# Patient Record
Sex: Female | Born: 1995 | Race: Black or African American | Hispanic: No | Marital: Single | Smoking: Never smoker
Health system: Southern US, Community
[De-identification: ages and names within clinical notes are randomized; demographics above are authoritative.]

## PROBLEM LIST (undated history)

## (undated) DIAGNOSIS — Z789 Other specified health status: Secondary | ICD-10-CM

## (undated) HISTORY — PX: RHINOPLASTY: SUR1284

---

## 2019-02-03 DIAGNOSIS — Z3400 Encounter for supervision of normal first pregnancy, unspecified trimester: Secondary | ICD-10-CM | POA: Insufficient documentation

## 2019-05-06 ENCOUNTER — Other Ambulatory Visit (HOSPITAL_COMMUNITY)
Admission: RE | Admit: 2019-05-06 | Discharge: 2019-05-06 | Disposition: A | Discharge status: Home / Self Care | Payer: Medicaid Other | Source: Ambulatory Visit | Attending: Advanced Practice Midwife | Admitting: Advanced Practice Midwife

## 2019-05-06 ENCOUNTER — Ambulatory Visit (INDEPENDENT_AMBULATORY_CARE_PROVIDER_SITE_OTHER): Payer: BC Managed Care – PPO | Admitting: Advanced Practice Midwife

## 2019-05-06 ENCOUNTER — Encounter: Payer: Self-pay | Admitting: Advanced Practice Midwife

## 2019-05-06 ENCOUNTER — Other Ambulatory Visit: Payer: Self-pay

## 2019-05-06 DIAGNOSIS — Z3A22 22 weeks gestation of pregnancy: Secondary | ICD-10-CM | POA: Diagnosis not present

## 2019-05-06 DIAGNOSIS — Z3402 Encounter for supervision of normal first pregnancy, second trimester: Secondary | ICD-10-CM | POA: Diagnosis not present

## 2019-05-06 DIAGNOSIS — Z3400 Encounter for supervision of normal first pregnancy, unspecified trimester: Secondary | ICD-10-CM | POA: Insufficient documentation

## 2019-05-06 MED ORDER — VITAFOL GUMMIES 3.33-0.333-34.8 MG PO CHEW: 34.8000 mg | CHEWABLE_TABLET | Freq: Every day | ORAL | 10 refills | Status: DC

## 2019-05-06 MED FILL — VITAFOL GUMMIES: 3.33-0.333- | 30 days supply | Qty: 90 | Fill #0

## 2019-05-06 NOTE — Patient Instructions (Signed)

## 2019-05-06 NOTE — Progress Notes (Deleted)
  Subjective:    Kristin Zimmerman is being seen today for her first obstetrical visit.  This {is/is not:9024} a planned pregnancy. She is at [redacted]w[redacted]d gestation. Her obstetrical history is significant for {ob risk factors:10154}. Relationship with FOB: {fob:16621}. Patient {does/does not:19097} intend to breast feed. Pregnancy history fully reviewed.  Patient reports {sx:14538}.  Review of Systems:   Review of Systems  Objective:     BP 112/68   Pulse 96   Ht 5\' 7"  (1.702 m)   Wt 102.1 kg   LMP 11/29/2018 (Exact Date)   BMI 35.26 kg/m  Physical Exam  Exam    Assessment:    Pregnancy: G1P0 Patient Active Problem List   Diagnosis Date Noted  . Supervision of normal first pregnancy, antepartum 02/03/2019       Plan:     Initial labs drawn. Prenatal vitamins. Problem list reviewed and updated. AFP3 discussed: {requests/ordered/declines:14581}. Role of ultrasound in pregnancy discussed; fetal survey: {requests/ordered/declines:14581}. Amniocentesis discussed: {amniocentesis:14582}. Follow up in {numbers 0-4:31231} weeks. ***% of *** min visit spent on counseling and coordination of care.  ***   Hansel Feinstein 05/06/2019

## 2019-05-06 NOTE — Progress Notes (Signed)
  Subjective:    Kristin Zimmerman is being seen today for her first obstetrical visit.  This is a planned pregnancy. She is at 31w4dgestation. Her obstetrical history is significant for Primigravida. Relationship with FOB: significant other, living together. Patient does intend to breast feed. Pregnancy history fully reviewed.  Patient reports no complaints.  [place] [facility] [place] [place]  [place] boyfriend is from here, met [place].  [place].    Review of Systems:   Review of Systems  Constitutional: Negative for chills and fever.  Respiratory: Negative for shortness of breath.   Gastrointestinal: Negative for abdominal pain, constipation, diarrhea, nausea and vomiting.  Genitourinary: Negative for pelvic pain and vaginal bleeding.  Musculoskeletal: Negative for back pain.    Objective:     BP 112/68   Pulse 96   Ht _0  (1.702 m)   Wt 102.1 kg   LMP 11/29/2018 (Exact Date)   BMI 35.26 kg/m  Physical Exam  Constitutional: She is oriented to person, place, and time. She appears well-developed and well-nourished. No distress.  HENT:  Head: Normocephalic.  Cardiovascular: Normal rate and regular rhythm.  Respiratory: Effort normal. No respiratory distress.  GI: Soft. She exhibits no distension. There is no abdominal tenderness. There is no rebound and no guarding.  Genitourinary:    Vulva normal.     Genitourinary Comments: Cervix long and closed Pap done at other office Records pending   Musculoskeletal: Normal range of motion.  Neurological: She is alert and oriented to person, place, and time.  Skin: Skin is warm and dry.  Psychiatric: She has a normal mood and affect.    Maternal Exam:  Abdomen: Patient reports no abdominal tenderness. Fetal presentation: no presenting part  Introitus: Normal vulva. Normal vagina.  Pelvis: adequate for delivery.   Cervix: Cervix evaluated by digital exam.     Fetal Exam Fetal  Monitor Review: Mode: hand-held doppler probe.   Doppler done by RN Pelvic exam done to check for early dilation, closed/long         Assessment:    Pregnancy: G1P0 Patient Active Problem List   Diagnosis Date Noted  . Supervision of normal first pregnancy, antepartum 05/06/2019       Plan:  Welcomed to practice Reviewed structure of practice including learners [facility]   Initial labs drawn. Prenatal vitamins. Problem list reviewed and updated. Panorama discussed: requested. Role of ultrasound in pregnancy discussed; fetal survey: ordered. Amniocentesis discussed: not indicated. Follow up in 4 weeks. 50% of 30 min visit spent on counseling and coordination of care.     MHansel Feinstein9/15/2020

## 2019-05-07 LAB — URINE CYTOLOGY ANCILLARY ONLY
Chlamydia: NEGATIVE
Neisseria Gonorrhea: NEGATIVE

## 2019-05-08 LAB — AFP, SERUM, OPEN SPINA BIFIDA
AFP MoM: 1.37
AFP Value: 90.7 ng/mL
Gest. Age on Collection Date: 22.4 weeks
Maternal Age At EDD: 23.1 yr
OSBR Risk 1 IN: 7840
Test Results:: NEGATIVE
Weight: 225 [lb_av]

## 2019-05-08 LAB — URINE CULTURE, OB REFLEX

## 2019-05-08 LAB — CULTURE, OB URINE

## 2019-05-14 ENCOUNTER — Other Ambulatory Visit: Payer: Self-pay

## 2019-05-14 ENCOUNTER — Other Ambulatory Visit: Payer: Self-pay | Admitting: Advanced Practice Midwife

## 2019-05-14 ENCOUNTER — Ambulatory Visit (HOSPITAL_COMMUNITY)
Admission: RE | Admit: 2019-05-14 | Discharge: 2019-05-14 | Disposition: A | Discharge status: Home / Self Care | Payer: Medicaid Other | Source: Ambulatory Visit | Attending: Advanced Practice Midwife | Admitting: Advanced Practice Midwife

## 2019-05-14 DIAGNOSIS — Z3A23 23 weeks gestation of pregnancy: Secondary | ICD-10-CM | POA: Diagnosis not present

## 2019-05-14 DIAGNOSIS — Z3400 Encounter for supervision of normal first pregnancy, unspecified trimester: Secondary | ICD-10-CM

## 2019-05-14 DIAGNOSIS — O99212 Obesity complicating pregnancy, second trimester: Secondary | ICD-10-CM

## 2019-05-14 DIAGNOSIS — Z3686 Encounter for antenatal screening for cervical length: Secondary | ICD-10-CM | POA: Diagnosis not present

## 2019-05-15 ENCOUNTER — Other Ambulatory Visit (HOSPITAL_COMMUNITY): Payer: Self-pay | Admitting: *Deleted

## 2019-05-15 DIAGNOSIS — Z362 Encounter for other antenatal screening follow-up: Secondary | ICD-10-CM

## 2019-05-21 ENCOUNTER — Telehealth: Payer: Self-pay

## 2019-05-21 DIAGNOSIS — Z3400 Encounter for supervision of normal first pregnancy, unspecified trimester: Secondary | ICD-10-CM

## 2019-05-21 NOTE — Telephone Encounter (Signed)
Patient made aware of panorama results. Kathrene Alu RN

## 2019-06-05 ENCOUNTER — Other Ambulatory Visit: Payer: Self-pay

## 2019-06-05 ENCOUNTER — Ambulatory Visit (INDEPENDENT_AMBULATORY_CARE_PROVIDER_SITE_OTHER): Payer: BC Managed Care – PPO | Admitting: Family Medicine

## 2019-06-05 ENCOUNTER — Encounter: Payer: Self-pay | Admitting: Family Medicine

## 2019-06-05 DIAGNOSIS — Z3400 Encounter for supervision of normal first pregnancy, unspecified trimester: Secondary | ICD-10-CM

## 2019-06-05 DIAGNOSIS — Z3402 Encounter for supervision of normal first pregnancy, second trimester: Secondary | ICD-10-CM

## 2019-06-05 DIAGNOSIS — Z3A26 26 weeks gestation of pregnancy: Secondary | ICD-10-CM

## 2019-06-05 NOTE — Progress Notes (Signed)
   PRENATAL VISIT NOTE  Subjective:  Kristin Zimmerman is a 23 y.o. G1P0 at [redacted]w[redacted]d being seen today for ongoing prenatal care.  She is currently monitored for the following issues for this low-risk pregnancy and has Supervision of normal first pregnancy, antepartum on their problem list.  Patient reports no complaints.  Contractions: Not present. Vag. Bleeding: None.  Movement: Present. Denies leaking of fluid.   The following portions of the patient's history were reviewed and updated as appropriate: allergies, current medications, past family history, past medical history, past social history, past surgical history and problem list.   Objective:   Vitals:   06/05/19 0946  BP: 106/64  Pulse: (!) 115  Weight: 237 lb (107.5 kg)    Fetal Status: Fetal Heart Rate (bpm): 141 Fundal Height: 27 cm Movement: Present     General:  Alert, oriented and cooperative. Patient is in no acute distress.  Skin: Skin is warm and dry. No rash noted.   Cardiovascular: Normal heart rate noted  Respiratory: Normal respiratory effort, no problems with respiration noted  Abdomen: Soft, gravid, appropriate for gestational age.  Pain/Pressure: Absent     Pelvic: Cervical exam deferred        Extremities: Normal range of motion.  Edema: None  Mental Status: Normal mood and affect. Normal behavior. Normal judgment and thought content.   Assessment and Plan:  Pregnancy: G1P0 at [redacted]w[redacted]d 1. Supervision of normal first pregnancy, antepartum Return next week for 2hr GTT and Tdap. FH and FHT normal  Preterm labor symptoms and general obstetric precautions including but not limited to vaginal bleeding, contractions, leaking of fluid and fetal movement were reviewed in detail with the patient. Please refer to After Visit Summary for other counseling recommendations.   No follow-ups on file.  Future Appointments  Date Time Provider [place]  06/11/2019  2:15 PM [place] 4 [facility]  07/07/2019  9:00  AM Truett Mainland, DO [facility] None    Truett Mainland, DO

## 2019-06-11 ENCOUNTER — Ambulatory Visit (HOSPITAL_COMMUNITY)
Admission: RE | Admit: 2019-06-11 | Discharge: 2019-06-11 | Disposition: A | Discharge status: Home / Self Care | Payer: Medicaid Other | Source: Ambulatory Visit | Attending: Maternal & Fetal Medicine | Admitting: Maternal & Fetal Medicine

## 2019-06-11 ENCOUNTER — Other Ambulatory Visit (INDEPENDENT_AMBULATORY_CARE_PROVIDER_SITE_OTHER): Payer: Medicaid Other

## 2019-06-11 ENCOUNTER — Other Ambulatory Visit: Payer: Self-pay

## 2019-06-11 DIAGNOSIS — Z3400 Encounter for supervision of normal first pregnancy, unspecified trimester: Secondary | ICD-10-CM

## 2019-06-11 DIAGNOSIS — Z362 Encounter for other antenatal screening follow-up: Secondary | ICD-10-CM | POA: Diagnosis not present

## 2019-06-11 DIAGNOSIS — O99212 Obesity complicating pregnancy, second trimester: Secondary | ICD-10-CM | POA: Diagnosis not present

## 2019-06-11 DIAGNOSIS — Z23 Encounter for immunization: Secondary | ICD-10-CM

## 2019-06-11 DIAGNOSIS — Z3A27 27 weeks gestation of pregnancy: Secondary | ICD-10-CM | POA: Diagnosis not present

## 2019-06-11 NOTE — Progress Notes (Signed)
Patient presents for 28 week labwork. Patient given Tdap and sent to lab for blood work. Kathrene Alu RN

## 2019-06-12 LAB — OBSTETRIC PANEL, INCLUDING HIV
Antibody Screen: NEGATIVE
Basophils Absolute: 0.1 10*3/uL (ref 0.0–0.2)
Basos: 0 %
EOS (ABSOLUTE): 0.2 10*3/uL (ref 0.0–0.4)
Eos: 1 %
HIV Screen 4th Generation wRfx: NONREACTIVE
Hematocrit: 36.3 % (ref 34.0–46.6)
Hemoglobin: 11.7 g/dL (ref 11.1–15.9)
Hepatitis B Surface Ag: NEGATIVE
Immature Grans (Abs): 0.4 10*3/uL — ABNORMAL HIGH (ref 0.0–0.1)
Immature Granulocytes: 3 %
Lymphocytes Absolute: 2.3 10*3/uL (ref 0.7–3.1)
Lymphs: 18 %
MCH: 27.4 pg (ref 26.6–33.0)
MCHC: 32.2 g/dL (ref 31.5–35.7)
MCV: 85 fL (ref 79–97)
Monocytes Absolute: 1 10*3/uL — ABNORMAL HIGH (ref 0.1–0.9)
Monocytes: 7 %
Neutrophils Absolute: 9.2 10*3/uL — ABNORMAL HIGH (ref 1.4–7.0)
Neutrophils: 71 %
Platelets: 207 10*3/uL (ref 150–450)
RBC: 4.27 x10E6/uL (ref 3.77–5.28)
RDW: 12.6 % (ref 11.7–15.4)
RPR Ser Ql: NONREACTIVE
Rh Factor: POSITIVE
Rubella Antibodies, IGG: 7.17 index (ref >0.99)
WBC: 13.1 10*3/uL — ABNORMAL HIGH (ref 3.4–10.8)

## 2019-06-12 LAB — GLUCOSE TOLERANCE, 2 HOURS W/ 1HR
Glucose, 1 hour: 154 mg/dL (ref 65–179)
Glucose, 2 hour: 74 mg/dL (ref 65–152)
Glucose, Fasting: 88 mg/dL (ref 65–91)

## 2019-07-07 ENCOUNTER — Ambulatory Visit (INDEPENDENT_AMBULATORY_CARE_PROVIDER_SITE_OTHER): Payer: BC Managed Care – PPO | Admitting: Family Medicine

## 2019-07-07 ENCOUNTER — Other Ambulatory Visit: Payer: Self-pay

## 2019-07-07 ENCOUNTER — Other Ambulatory Visit (HOSPITAL_COMMUNITY)
Admission: RE | Admit: 2019-07-07 | Discharge: 2019-07-07 | Disposition: A | Discharge status: Home / Self Care | Payer: Medicaid Other | Source: Ambulatory Visit | Attending: Family Medicine | Admitting: Family Medicine

## 2019-07-07 VITALS — BP 124/79 | HR 107 | Wt 254.1 lb

## 2019-07-07 DIAGNOSIS — O99891 Other specified diseases and conditions complicating pregnancy: Secondary | ICD-10-CM

## 2019-07-07 DIAGNOSIS — M9905 Segmental and somatic dysfunction of pelvic region: Secondary | ICD-10-CM | POA: Diagnosis not present

## 2019-07-07 DIAGNOSIS — M9904 Segmental and somatic dysfunction of sacral region: Secondary | ICD-10-CM

## 2019-07-07 DIAGNOSIS — N898 Other specified noninflammatory disorders of vagina: Secondary | ICD-10-CM

## 2019-07-07 DIAGNOSIS — M549 Dorsalgia, unspecified: Secondary | ICD-10-CM

## 2019-07-07 DIAGNOSIS — M9903 Segmental and somatic dysfunction of lumbar region: Secondary | ICD-10-CM

## 2019-07-07 DIAGNOSIS — Z3A31 31 weeks gestation of pregnancy: Secondary | ICD-10-CM

## 2019-07-07 DIAGNOSIS — Z3400 Encounter for supervision of normal first pregnancy, unspecified trimester: Secondary | ICD-10-CM

## 2019-07-07 MED FILL — VITAFOL GUMMIES: 3.33-0.333- | 30 days supply | Qty: 90 | Fill #0

## 2019-07-07 NOTE — Progress Notes (Signed)
   PRENATAL VISIT NOTE  Subjective:  Kristin Zimmerman is a 23 y.o. G1P0 at [redacted]w[redacted]d being seen today for ongoing prenatal care.  She is currently monitored for the following issues for this low-risk pregnancy and has Supervision of normal first pregnancy, antepartum on their problem list.  Patient reports backache, left sided. No trauma. Pain radiates down leg.  Contractions: Not present. Vag. Bleeding: None.  Movement: Present. Denies leaking of fluid.   The following portions of the patient's history were reviewed and updated as appropriate: allergies, current medications, past family history, past medical history, past social history, past surgical history and problem list. Problem list updated.  Objective:   Vitals:   07/07/19 0913  BP: 124/79  Pulse: (!) 107  Weight: 254 lb 1.3 oz (115.2 kg)    Fetal Status: Fetal Heart Rate (bpm): 135   Movement: Present     General:  Alert, oriented and cooperative. Patient is in no acute distress.  Skin: Skin is warm and dry. No rash noted.   Cardiovascular: Normal heart rate noted  Respiratory: Normal respiratory effort, no problems with respiration noted  Abdomen: Soft, gravid, appropriate for gestational age. Pain/Pressure: Present     Pelvic:  Cervical exam deferred        MSK: Restriction, tenderness, tissue texture changes, and paraspinal spasm in the lumbar spine  Neuro: Moves all four extremities with no focal neurological deficit  Extremities: Normal range of motion.  Edema: Trace  Mental Status: Normal mood and affect. Normal behavior. Normal judgment and thought content.   OSE: Head   Cervical   Thoracic   Rib   Lumbar L5 ESRL  Sacrum L/L  Pelvis Right ant innom    Assessment and Plan:  Pregnancy: G1P0 at [redacted]w[redacted]d  1. Supervision of normal first pregnancy, antepartum FHT and FH normal  2. Vaginal discharge Abnormal discharge. Wet prep done  3. Back pain affecting pregnancy in third trimester 4. Somatic dysfunction of  lumbar region 5. Somatic dysfunction of sacral region 6. Somatic dysfunction of pelvis region OMT done after patient permission. HVLA technique utilized. 3 areas treated with improvement of tissue texture and joint mobility. Patient tolerated procedure well.     Preterm labor symptoms and general obstetric precautions including but not limited to vaginal bleeding, contractions, leaking of fluid and fetal movement were reviewed in detail with the patient. Please refer to After Visit Summary for other counseling recommendations.  Return in about 2 weeks (around 07/21/2019) for OB f/u, In Office.  Truett Mainland, DO

## 2019-07-08 LAB — CERVICOVAGINAL ANCILLARY ONLY
Chlamydia: NEGATIVE
Comment: NEGATIVE
Comment: NORMAL
Neisseria Gonorrhea: NEGATIVE

## 2019-07-24 ENCOUNTER — Encounter: Payer: Self-pay | Admitting: Family Medicine

## 2019-07-24 ENCOUNTER — Ambulatory Visit (INDEPENDENT_AMBULATORY_CARE_PROVIDER_SITE_OTHER): Payer: BC Managed Care – PPO | Admitting: Family Medicine

## 2019-07-24 ENCOUNTER — Other Ambulatory Visit: Payer: Self-pay

## 2019-07-24 VITALS — BP 132/71 | HR 108 | Wt 260.0 lb

## 2019-07-24 DIAGNOSIS — Z3403 Encounter for supervision of normal first pregnancy, third trimester: Secondary | ICD-10-CM

## 2019-07-24 DIAGNOSIS — Z3A33 33 weeks gestation of pregnancy: Secondary | ICD-10-CM

## 2019-07-24 DIAGNOSIS — Z3400 Encounter for supervision of normal first pregnancy, unspecified trimester: Secondary | ICD-10-CM

## 2019-07-24 MED FILL — VITAFOL GUMMIES: 3.33-0.333- | 30 days supply | Qty: 90 | Fill #0

## 2019-07-24 NOTE — Progress Notes (Signed)
   PRENATAL VISIT NOTE  Subjective:  Kristin Zimmerman is a 23 y.o. G1P0 at [redacted]w[redacted]d being seen today for ongoing prenatal care.  She is currently monitored for the following issues for this low-risk pregnancy and has Supervision of normal first pregnancy, antepartum on their problem list.  Patient reports no complaints. She states that she has been doing well. She has had difficulty sleeping during her pregnancy and typically sleeps at 6am and wakes up at 1pm. Patient states that she has kept active, but is looking forward to giving birth soon to return to "normal". Endorses adequate support system. Contractions: Not present. Vag. Bleeding: None.  Movement: Present. Denies leaking of fluid.   The following portions of the patient's history were reviewed and updated as appropriate: allergies, current medications, past family history, past medical history, past social history, past surgical history and problem list.   Objective:   Vitals:   07/24/19 1430  BP: 132/71  Pulse: (!) 108  Weight: 260 lb (117.9 kg)    Fetal Status:     Movement: Present   FHR: 143. Fundal Height: 34cm.  General:  Alert, oriented and cooperative. Patient is in no acute distress.  Skin: Skin is warm and dry. No rash noted.   Cardiovascular: Normal heart rate noted  Respiratory: Normal respiratory effort, no problems with respiration noted  Abdomen: Soft, gravid, appropriate for gestational age.  Pain/Pressure: Present     Pelvic: Cervical exam deferred        Extremities: Normal range of motion.     Mental Status: Normal mood and affect. Normal behavior. Normal judgment and thought content.   Assessment and Plan:  Pregnancy: G1P0 at [redacted]w[redacted]d  Ms. Prien is a 23yo G1P0 who presents for [redacted]w[redacted]d prenatal visit. Her pregnancy is progressing well, with stable vital signs and normal/expected fetal growth.  Supervision of normal first pregnancy, antepartum - Return in 2 weeks for 36 week prenatal visit, and as needed before  then.  Term labor symptoms and general obstetric precautions including but not limited to vaginal bleeding, contractions, leaking of fluid and fetal movement were reviewed in detail with the patient. Please refer to After Visit Summary for other counseling recommendations.   No follow-ups on file.  Future Appointments  Date Time Provider [place]  08/08/2019  9:30 AM Lavonia Drafts, MD [facility] None  08/20/2019  2:15 PM Truett Mainland, DO [facility] None  08/28/2019  2:30 PM Nehemiah Settle, Tanna Savoy, DO [facility] None    Cristela Felt, Medical Student   Patient seen in conjunction with medical student. No complaints. FHT and FH normal.  Truett Mainland, DO 07/24/2019 3:32 PM

## 2019-07-28 ENCOUNTER — Encounter (HOSPITAL_COMMUNITY): Payer: Self-pay | Admitting: *Deleted

## 2019-07-28 ENCOUNTER — Inpatient Hospital Stay (HOSPITAL_COMMUNITY)
Admission: AD | Admit: 2019-07-28 | Discharge: 2019-07-28 | Disposition: A | Discharge status: Home / Self Care | Payer: Medicaid Other | Attending: Obstetrics and Gynecology | Admitting: Obstetrics and Gynecology

## 2019-07-28 ENCOUNTER — Other Ambulatory Visit: Payer: Self-pay

## 2019-07-28 ENCOUNTER — Telehealth: Payer: Self-pay

## 2019-07-28 ENCOUNTER — Inpatient Hospital Stay (HOSPITAL_BASED_OUTPATIENT_CLINIC_OR_DEPARTMENT_OTHER): Payer: Medicaid Other

## 2019-07-28 DIAGNOSIS — W19XXXA Unspecified fall, initial encounter: Secondary | ICD-10-CM | POA: Insufficient documentation

## 2019-07-28 DIAGNOSIS — O99213 Obesity complicating pregnancy, third trimester: Secondary | ICD-10-CM

## 2019-07-28 DIAGNOSIS — O368130 Decreased fetal movements, third trimester, not applicable or unspecified: Secondary | ICD-10-CM

## 2019-07-28 DIAGNOSIS — O368131 Decreased fetal movements, third trimester, fetus 1: Secondary | ICD-10-CM

## 2019-07-28 DIAGNOSIS — Z3A34 34 weeks gestation of pregnancy: Secondary | ICD-10-CM | POA: Insufficient documentation

## 2019-07-28 DIAGNOSIS — Z3400 Encounter for supervision of normal first pregnancy, unspecified trimester: Secondary | ICD-10-CM

## 2019-07-28 DIAGNOSIS — O368190 Decreased fetal movements, unspecified trimester, not applicable or unspecified: Secondary | ICD-10-CM

## 2019-07-28 DIAGNOSIS — Z3689 Encounter for other specified antenatal screening: Secondary | ICD-10-CM

## 2019-07-28 DIAGNOSIS — Z043 Encounter for examination and observation following other accident: Secondary | ICD-10-CM | POA: Diagnosis not present

## 2019-07-28 HISTORY — DX: Other specified health status: Z78.9

## 2019-07-28 NOTE — MAU Note (Addendum)
Pt reports she mistepped a fell forward and on her right side. Not sure if she hit her abd or not. Incident happened last night around &PM. Pt stated she had not felt baby move all nigth because he stomach felt "hard all night. Called her doctor today and she told her to come in. Denies any vag bleeding or leaking since the fall. Stated stomach feel softer now. Denis any abd pain but reports some back pain that is sore and constant.

## 2019-07-28 NOTE — Discharge Instructions (Signed)
Fetal Movement Counts Patient Name: ________________________________________________ Patient Due Date: ____________________ What is a fetal movement count?  A fetal movement count is the number of times that you feel your baby move during a certain amount of time. This may also be called a fetal kick count. A fetal movement count is recommended for every pregnant woman. You may be asked to start counting fetal movements as early as week 28 of your pregnancy. Pay attention to when your baby is most active. You may notice your baby's sleep and wake cycles. You may also notice things that make your baby move more. You should do a fetal movement count:  When your baby is normally most active.  At the same time each day. A good time to count movements is while you are resting, after having something to eat and drink. How do I count fetal movements? 1. Find a quiet, comfortable area. Sit, or lie down on your side. 2. Write down the date, the start time and stop time, and the number of movements that you felt between those two times. Take this information with you to your health care visits. 3. For 2 hours, count kicks, flutters, swishes, rolls, and jabs. You should feel at least 10 movements during 2 hours. 4. You may stop counting after you have felt 10 movements. 5. If you do not feel 10 movements in 2 hours, have something to eat and drink. Then, keep resting and counting for 1 hour. If you feel at least 4 movements during that hour, you may stop counting. Contact a health care provider if:  You feel fewer than 4 movements in 2 hours.  Your baby is not moving like he or she usually does. Date: ____________ Start time: ____________ Stop time: ____________ Movements: ____________ Date: ____________ Start time: ____________ Stop time: ____________ Movements: ____________ Date: ____________ Start time: ____________ Stop time: ____________ Movements: ____________ Date: ____________ Start time:  ____________ Stop time: ____________ Movements: ____________ Date: ____________ Start time: ____________ Stop time: ____________ Movements: ____________ Date: ____________ Start time: ____________ Stop time: ____________ Movements: ____________ Date: ____________ Start time: ____________ Stop time: ____________ Movements: ____________ Date: ____________ Start time: ____________ Stop time: ____________ Movements: ____________ Date: ____________ Start time: ____________ Stop time: ____________ Movements: ____________ This information is not intended to replace advice given to you by your health care provider. Make sure you discuss any questions you have with your health care provider. Document Released: 09/06/2006 Document Revised: 08/27/2018 Document Reviewed: 09/16/2015 Elsevier Patient Education  2020 [place].   Preventing Injuries During Pregnancy  Injuries can happen during pregnancy. Minor falls and accidents usually do not harm you or your baby. But some injuries can harm you and your baby. Tell your doctor about any injury you suffer. What can I do to avoid injuries? Safety  Remove rugs and loose objects on the floor.  Wear comfortable shoes that have a good grip. Do not wear shoes that have high heels.  Always wear your seat belt in the car. The lap belt should be below your belly. Always drive safely.  Do not ride on a motorcycle. Activity  Do not take part in rough and violent activities or sports.  Avoid: ? Walking on wet or slippery floors. ? Lifting heavy pots of boiling or hot liquids. ? Fixing electrical problems. ? Being near fires. General instructions  Take over-the-counter and prescription medicines only as told by your doctor.  Know your blood type and the blood type of the baby's father.  If  you are a victim of domestic violence: ? Call your local emergency services (911 in the [place] ? Contact the [employer] Violence Hotline for help and  support. Get help right away if:  You fall on your belly or receive any serious blow to your belly.  You have a stiff neck or neck pain after a fall or an injury.  You get a headache or have problems with vision after an injury.  You do not feel the baby move or the baby is not moving as much as normal.  You have been a victim of domestic violence or any other kind of attack.  You have been in a car accident.  You have bleeding from your vagina.  Fluid is leaking from your vagina.  You start to have cramping or pain in your belly (contractions).  You have very bad pain in your lower back.  You feel weak or pass out (faint).  You start to throw up (vomit) after an injury.  You have been burned. Summary  Some injuries that happen during pregnancy can do harm to the baby.  Tell your doctor about any injury.  Take steps to avoid injury. This includes removing rugs and loose objects on the floor. Always wear your seat belt in the car.  Do not take part in rough and violent activities or sports.  Get help right away if you have any serious accident or injury. This information is not intended to replace advice given to you by your health care provider. Make sure you discuss any questions you have with your health care provider. Document Released: 09/09/2010 Document Revised: 08/16/2016 Document Reviewed: 08/16/2016 Elsevier Patient Education  2020 [employer].

## 2019-07-28 NOTE — MAU Provider Note (Signed)
History     CSN: [id number]  Arrival date and time: 07/28/19 1537   First Provider Initiated Contact with Patient 07/28/19 1626      Chief Complaint  Patient presents with  . Fall   23 y.o. G1 @34 .3 wks presenting after a fall. Report twisting her ankle and falling down onto her knees and left side. Event happened around 7pm. Reports no FM since the fall. Reports abdomen was tight yesterday and earlier today. Denies VB, LOF, and ctx.   OB History    Gravida  1   Para      Term      Preterm      AB      Living        SAB      TAB      Ectopic      Multiple      Live Births              Past Medical History:  Diagnosis Date  . Medical history non-contributory     Past Surgical History:  Procedure Laterality Date  . RHINOPLASTY      History reviewed. No pertinent family history.  Social History   Tobacco Use  . Smoking status: Never Smoker  . Smokeless tobacco: Never Used  Substance Use Topics  . Alcohol use: Never    Frequency: Never  . Drug use: Never    Allergies:  Allergies  Allergen Reactions  . Other Nausea And Vomiting    Lobster    Medications Prior to Admission  Medication Sig Dispense Refill Last Dose  . Prenatal Vit-Fe Fumarate-FA (PRENATAL VITAMINS PO) Take by mouth.   07/28/2019 at Unknown time  . Prenatal Vit-Fe Phos-FA-Omega (VITAFOL GUMMIES) 3.33-0.333-34.8 MG CHEW Chew 34.8 mg by mouth daily. 90 tablet 10 07/28/2019 at Unknown time  . montelukast (SINGULAIR) 10 MG tablet Take by mouth.   More than a month at Unknown time  . Prenatal Vit-Min-FA-Fish Oil ([employer] PRENATAL GUMMY) 0.4-113.5 MG CHEW Chew by mouth.       Review of Systems  Gastrointestinal: Negative for abdominal pain.  Genitourinary: Negative for vaginal bleeding.   Physical Exam   Blood pressure 135/80, pulse (!) 113, temperature 99.1 F (37.3 C), height 5\' 7"  (1.702 m), weight 111.6 kg, last menstrual period 11/29/2018.  Physical Exam  Nursing note and  vitals reviewed. Constitutional: She is oriented to person, place, and time. She appears well-developed and well-nourished. No distress.  HENT:  Head: Normocephalic and atraumatic.  Neck: Normal range of motion.  Cardiovascular: Normal rate.  Respiratory: Effort normal. No respiratory distress.  GI: Soft. She exhibits no distension. There is no abdominal tenderness.  gravid  Musculoskeletal: Normal range of motion.  Neurological: She is alert and oriented to person, place, and time.  Skin: Skin is warm and dry.  Psychiatric: She has a normal mood and affect.  EFM: 140 bpm, mod variability, + accels, no decels Toco: irregular  No results found for this or any previous visit (from the past 24 hour(s)).  MAU Course  Procedures  MDM BPP ordered. BPP 8/8, AFI 12cm. Pt reports feeling FM during [place]. NST reactive. Pt reassured. Stable for discharge home.   Assessment and Plan  [redacted] weeks gestation NST reactive S/p fall Decreased FM Discharge home Follow up at [facility] as scheduled [facility]  Allergies as of 07/28/2019      Reactions   Other Nausea And Vomiting   Lobster      Medication  List    TAKE these medications   [facility]S Prenatal Gummy 0.4-113.5 MG Chew Chew by mouth.   montelukast 10 MG tablet Commonly known as: SINGULAIR Take by mouth.   PRENATAL VITAMINS PO Take by mouth.   Vitafol Gummies 3.33-0.333-34.8 MG Chew Chew 34.8 mg by mouth daily.       Donette Larry, CNM 07/28/2019, 5:33 PM

## 2019-07-28 NOTE — Telephone Encounter (Signed)
Patient called stating that she fell yesterday. Patient states that she did not hit her belly directly. Patient asked if she has felt baby moving and states" not really cause my belly is hard." patient denies any bleeding or leaking of fluid. Patient advised there are no provider in office this afternoon and she should proceed straight to MAU at [facility].  Patient screened for covid and denies any symptoms. Also denies being around someone positive and also denies being tested in last fourteen days. Instructed to go to MAU now for evaluation. Kathrene Alu RN

## 2019-08-08 ENCOUNTER — Other Ambulatory Visit (HOSPITAL_COMMUNITY)
Admission: RE | Admit: 2019-08-08 | Discharge: 2019-08-08 | Disposition: A | Discharge status: Home / Self Care | Payer: Medicaid Other | Source: Ambulatory Visit | Attending: Obstetrics & Gynecology | Admitting: Obstetrics & Gynecology

## 2019-08-08 ENCOUNTER — Other Ambulatory Visit: Payer: Self-pay

## 2019-08-08 ENCOUNTER — Encounter: Payer: Self-pay | Admitting: Obstetrics & Gynecology

## 2019-08-08 ENCOUNTER — Ambulatory Visit (INDEPENDENT_AMBULATORY_CARE_PROVIDER_SITE_OTHER): Payer: BC Managed Care – PPO | Admitting: Obstetrics & Gynecology

## 2019-08-08 VITALS — BP 122/83 | HR 112 | Wt 273.0 lb

## 2019-08-08 DIAGNOSIS — Z3400 Encounter for supervision of normal first pregnancy, unspecified trimester: Secondary | ICD-10-CM

## 2019-08-08 DIAGNOSIS — Z3A36 36 weeks gestation of pregnancy: Secondary | ICD-10-CM

## 2019-08-08 DIAGNOSIS — Z3403 Encounter for supervision of normal first pregnancy, third trimester: Secondary | ICD-10-CM

## 2019-08-08 LAB — OB RESULTS CONSOLE GC/CHLAMYDIA: Gonorrhea: NEGATIVE

## 2019-08-08 NOTE — Progress Notes (Signed)
   PRENATAL VISIT NOTE  Subjective:  Kristin Zimmerman is a 23 y.o. G1P0 at [redacted]w[redacted]d being seen today for ongoing prenatal care.  She is currently monitored for the following issues for this low-risk pregnancy and has Supervision of normal first pregnancy, antepartum on their problem list.  Patient reports no complaints.  Contractions: Not present. Vag. Bleeding: None.  Movement: Present. Denies leaking of fluid.   The following portions of the patient's history were reviewed and updated as appropriate: allergies, current medications, past family history, past medical history, past social history, past surgical history and problem list.   Objective:   Vitals:   08/08/19 0949  BP: 122/83  Pulse: (!) 112  Weight: 273 lb (123.8 kg)    Fetal Status: Fetal Heart Rate (bpm): 141   Movement: Present     General:  Alert, oriented and cooperative. Patient is in no acute distress.  Skin: Skin is warm and dry. No rash noted.   Cardiovascular: Normal heart rate noted  Respiratory: Normal respiratory effort, no problems with respiration noted  Abdomen: Soft, gravid, appropriate for gestational age.  Pain/Pressure: Absent     Pelvic: Cervical exam performed   Extremities: Normal range of motion.  Edema: Trace  Mental Status: Normal mood and affect. Normal behavior. Normal judgment and thought content.   Assessment and Plan:  Pregnancy: G1P0 at [redacted]w[redacted]d 1. Supervision of normal first pregnancy, antepartum 36 week cx done Good FM  FH WNL  Preterm labor symptoms and general obstetric precautions including but not limited to vaginal bleeding, contractions, leaking of fluid and fetal movement were reviewed in detail with the patient. Please refer to After Visit Summary for other counseling recommendations.   Return in about 2 weeks (around 08/22/2019) for in person.  Future Appointments  Date Time Provider [place]  08/20/2019  2:15 PM Truett Mainland, DO [facility] None  08/28/2019  2:30 PM  Nehemiah Settle Tanna Savoy, DO [facility] None    Lavonia Drafts, MD

## 2019-08-08 NOTE — Patient Instructions (Signed)
Third Trimester of Pregnancy The third trimester is from week 28 through week 40 (months 7 through 9). The third trimester is a time when the unborn baby (fetus) is growing rapidly. At the end of the ninth month, the fetus is about 20 inches in length and weighs 6-10 pounds. Body changes during your third trimester Your body will continue to go through many changes during pregnancy. The changes vary from woman to woman. During the third trimester:  Your weight will continue to increase. You can expect to gain 25-35 pounds (11-16 kg) by the end of the pregnancy.  You may begin to get stretch marks on your hips, abdomen, and breasts.  You may urinate more often because the fetus is moving lower into your pelvis and pressing on your bladder.  You may develop or continue to have heartburn. This is caused by increased hormones that slow down muscles in the digestive tract.  You may develop or continue to have constipation because increased hormones slow digestion and cause the muscles that push waste through your intestines to relax.  You may develop hemorrhoids. These are swollen veins (varicose veins) in the rectum that can itch or be painful.  You may develop swollen, bulging veins (varicose veins) in your legs.  You may have increased body aches in the pelvis, back, or thighs. This is due to weight gain and increased hormones that are relaxing your joints.  You may have changes in your hair. These can include thickening of your hair, rapid growth, and changes in texture. Some women also have hair loss during or after pregnancy, or hair that feels dry or thin. Your hair will most likely return to normal after your baby is born.  Your breasts will continue to grow and they will continue to become tender. A yellow fluid (colostrum) may leak from your breasts. This is the first milk you are producing for your baby.  Your belly button may stick out.  You may notice more swelling in your hands,  face, or ankles.  You may have increased tingling or numbness in your hands, arms, and legs. The skin on your belly may also feel numb.  You may feel short of breath because of your expanding uterus.  You may have more problems sleeping. This can be caused by the size of your belly, increased need to urinate, and an increase in your body's metabolism.  You may notice the fetus "dropping," or moving lower in your abdomen (lightening).  You may have increased vaginal discharge.  You may notice your joints feel loose and you may have pain around your pelvic bone. What to expect at prenatal visits You will have prenatal exams every 2 weeks until week 36. Then you will have weekly prenatal exams. During a routine prenatal visit:  You will be weighed to make sure you and the baby are growing normally.  Your blood pressure will be taken.  Your abdomen will be measured to track your baby's growth.  The fetal heartbeat will be listened to.  Any test results from the previous visit will be discussed.  You may have a cervical check near your due date to see if your cervix has softened or thinned (effaced).  You will be tested for Group B streptococcus. This happens between 35 and 37 weeks. Your health care provider may ask you:  What your birth plan is.  How you are feeling.  If you are feeling the baby move.  If you have had any abnormal   symptoms, such as leaking fluid, bleeding, severe headaches, or abdominal cramping.  If you are using any tobacco products, including cigarettes, chewing tobacco, and electronic cigarettes.  If you have any questions. Other tests or screenings that may be performed during your third trimester include:  Blood tests that check for low iron levels (anemia).  Fetal testing to check the health, activity level, and growth of the fetus. Testing is done if you have certain medical conditions or if there are problems during the pregnancy.  Nonstress test  (NST). This test checks the health of your baby to make sure there are no signs of problems, such as the baby not getting enough oxygen. During this test, a belt is placed around your belly. The baby is made to move, and its heart rate is monitored during movement. What is false labor? False labor is a condition in which you feel small, irregular tightenings of the muscles in the womb (contractions) that usually go away with rest, changing position, or drinking water. These are called Braxton Hicks contractions. Contractions may last for hours, days, or even weeks before true labor sets in. If contractions come at regular intervals, become more frequent, increase in intensity, or become painful, you should see your health care provider. What are the signs of labor?  Abdominal cramps.  Regular contractions that start at 10 minutes apart and become stronger and more frequent with time.  Contractions that start on the top of the uterus and spread down to the lower abdomen and back.  Increased pelvic pressure and dull back pain.  A watery or bloody mucus discharge that comes from the vagina.  Leaking of amniotic fluid. This is also known as your "water breaking." It could be a slow trickle or a gush. Let your health care provider know if it has a color or strange odor. If you have any of these signs, call your health care provider right away, even if it is before your due date. Follow these instructions at home: Medicines  Follow your health care provider's instructions regarding medicine use. Specific medicines may be either safe or unsafe to take during pregnancy.  Take a prenatal vitamin that contains at least 600 micrograms (mcg) of folic acid.  If you develop constipation, try taking a stool softener if your health care provider approves. Eating and drinking   Eat a balanced diet that includes fresh fruits and vegetables, whole grains, good sources of protein such as meat, eggs, or tofu,  and low-fat dairy. Your health care provider will help you determine the amount of weight gain that is right for you.  Avoid raw meat and uncooked cheese. These carry germs that can cause birth defects in the baby.  If you have low calcium intake from food, talk to your health care provider about whether you should take a daily calcium supplement.  Eat four or five small meals rather than three large meals a day.  Limit foods that are high in fat and processed sugars, such as fried and sweet foods.  To prevent constipation: ? Drink enough fluid to keep your urine clear or pale yellow. ? Eat foods that are high in fiber, such as fresh fruits and vegetables, whole grains, and beans. Activity  Exercise only as directed by your health care provider. Most women can continue their usual exercise routine during pregnancy. Try to exercise for 30 minutes at least 5 days a week. Stop exercising if you experience uterine contractions.  Avoid heavy lifting.  Do   not exercise in extreme heat or humidity, or at high altitudes.  Wear low-heel, comfortable shoes.  Practice good posture.  You may continue to have sex unless your health care provider tells you otherwise. Relieving pain and discomfort  Take frequent breaks and rest with your legs elevated if you have leg cramps or low back pain.  Take warm sitz baths to soothe any pain or discomfort caused by hemorrhoids. Use hemorrhoid cream if your health care provider approves.  Wear a good support bra to prevent discomfort from breast tenderness.  If you develop varicose veins: ? Wear support pantyhose or compression stockings as told by your healthcare provider. ? Elevate your feet for 15 minutes, 3-4 times a day. Prenatal care  Write down your questions. Take them to your prenatal visits.  Keep all your prenatal visits as told by your health care provider. This is important. Safety  Wear your seat belt at all times when driving.  Make  a list of emergency phone numbers, including numbers for family, friends, the hospital, and police and fire departments. General instructions  Avoid cat litter boxes and soil used by cats. These carry germs that can cause birth defects in the baby. If you have a cat, ask someone to clean the litter box for you.  Do not travel far distances unless it is absolutely necessary and only with the approval of your health care provider.  Do not use hot tubs, steam rooms, or saunas.  Do not drink alcohol.  Do not use any products that contain nicotine or tobacco, such as cigarettes and e-cigarettes. If you need help quitting, ask your health care provider.  Do not use any medicinal herbs or unprescribed drugs. These chemicals affect the formation and growth of the baby.  Do not douche or use tampons or scented sanitary pads.  Do not cross your legs for long periods of time.  To prepare for the arrival of your baby: ? Take prenatal classes to understand, practice, and ask questions about labor and delivery. ? Make a trial run to the hospital. ? Visit the hospital and tour the maternity area. ? Arrange for maternity or paternity leave through employers. ? Arrange for family and friends to take care of pets while you are in the hospital. ? Purchase a rear-facing car seat and make sure you know how to install it in your car. ? Pack your hospital bag. ? Prepare the baby's nursery. Make sure to remove all pillows and stuffed animals from the baby's crib to prevent suffocation.  Visit your dentist if you have not gone during your pregnancy. Use a soft toothbrush to brush your teeth and be gentle when you floss. Contact a health care provider if:  You are unsure if you are in labor or if your water has broken.  You become dizzy.  You have mild pelvic cramps, pelvic pressure, or nagging pain in your abdominal area.  You have lower back pain.  You have persistent nausea, vomiting, or diarrhea.   You have an unusual or bad smelling vaginal discharge.  You have pain when you urinate. Get help right away if:  Your water breaks before 37 weeks.  You have regular contractions less than 5 minutes apart before 37 weeks.  You have a fever.  You are leaking fluid from your vagina.  You have spotting or bleeding from your vagina.  You have severe abdominal pain or cramping.  You have rapid weight loss or weight gain.  You have   shortness of breath with chest pain.  You notice sudden or extreme swelling of your face, hands, ankles, feet, or legs.  Your baby makes fewer than 10 movements in 2 hours.  You have severe headaches that do not go away when you take medicine.  You have vision changes. Summary  The third trimester is from week 28 through week 40, months 7 through 9. The third trimester is a time when the unborn baby (fetus) is growing rapidly.  During the third trimester, your discomfort may increase as you and your baby continue to gain weight. You may have abdominal, leg, and back pain, sleeping problems, and an increased need to urinate.  During the third trimester your breasts will keep growing and they will continue to become tender. A yellow fluid (colostrum) may leak from your breasts. This is the first milk you are producing for your baby.  False labor is a condition in which you feel small, irregular tightenings of the muscles in the womb (contractions) that eventually go away. These are called Braxton Hicks contractions. Contractions may last for hours, days, or even weeks before true labor sets in.  Signs of labor can include: abdominal cramps; regular contractions that start at 10 minutes apart and become stronger and more frequent with time; watery or bloody mucus discharge that comes from the vagina; increased pelvic pressure and dull back pain; and leaking of amniotic fluid. This information is not intended to replace advice given to you by your health  care provider. Make sure you discuss any questions you have with your health care provider. Document Released: 08/01/2001 Document Revised: 11/28/2018 Document Reviewed: 09/12/2016 Elsevier Patient Education  2020 [employer]  

## 2019-08-11 LAB — GC/CHLAMYDIA PROBE AMP (~~LOC~~) NOT AT ARMC
Chlamydia: NEGATIVE
Comment: NEGATIVE
Comment: NORMAL
Neisseria Gonorrhea: NEGATIVE

## 2019-08-12 LAB — OB RESULTS CONSOLE GBS: GBS: NEGATIVE

## 2019-08-12 LAB — CULTURE, BETA STREP (GROUP B ONLY): Strep Gp B Culture: NEGATIVE

## 2019-08-20 ENCOUNTER — Encounter: Payer: Medicaid Other | Admitting: Family Medicine

## 2019-08-22 NOTE — L&D Delivery Note (Signed)
OB/GYN Faculty Practice Delivery Note  Christain Mcraney is a 24 y.o. G1P1001 s/p VAVD at [redacted]w[redacted]d. She was admitted for IOL for gHTN.   ROM: 10h 67m with light meconium fluid GBS Status: Negative/-- (12/22 0000) Maximum Maternal Temperature: 99.0 F   Labor Progress: . Patient arrived at 0 cm dilation and was induced with cyto x3, foley bulb, pitocin, and AROM. After pushing for 2.5 hours patient was exhausted and FHT was Cat II, at which point she was offered and accepted successful VAVD.  Delivery Date/Time: 08/30/2019 at 2305  Operative Delivery Note Infant was delivered via Vacuum Assisted Vaginal Delivery due to maternal exhaustion and NRFHT.  The patient was examined and found to be Presentation: vertex; Position: Left,, Occiput,, Anterior; Station: +4.  Verbal consent: obtained from patient.  Risks and benefits discussed in detail.  Risks include, but are not limited to the risks of anesthesia, bleeding, infection, damage to maternal tissues, fetal cephalhematoma.  There is also the risk of inability to effect vaginal delivery of the head, or shoulder dystocia that cannot be resolved by established maneuvers, leading to the need for emergency cesarean section.  The Kiwi was positioned over the sagittal suture 3 cm anterior to posterior fontanelle.  Pressure was then increased to 500 mmHg, and the patient was instructed to push.  Pulling was administered along the pelvic curve.  2 pulls were administered during 2 contractions, with release of pressure between contractions.  No popoffs.  The infant was then delivered atraumatically. Loose nuchal x1 easily reduced. Shoulder and body delivered in usual fashion. Infant with spontaneous cry, placed on mother's abdomen, dried and stimulated. Cord clamped x 2 after 1-minute delay, and cut by FOB. Cord blood drawn. Placenta delivered spontaneously with gentle cord traction. Fundus firm with massage and Pitocin. Labia, perineum, vagina, and cervix inspected  with 2nd degree perineal laceration which was repaired in the usual fashion.   Sponge, instrument and needle counts were correct x2.  Placenta: 3v intact, family elected to keep placenta for encapsulation for which they brought their own equipment. Patient GBS negative.  Complications: none Lacerations: 2nd degree perineal, repaired EBL: 911 cc Analgesia: epidural   Infant: APGAR (1 MIN): 8   APGAR (5 MINS): 9    Weight: 3226 grams  Zack Seal, MD/[facility] OB/GYN Fellow, Faculty Practice

## 2019-08-28 ENCOUNTER — Inpatient Hospital Stay (HOSPITAL_COMMUNITY)
Admission: AD | Admit: 2019-08-28 | Discharge: 2019-08-31 | DRG: 807 | Disposition: A | Discharge status: Home / Self Care | Payer: BC Managed Care – PPO | Attending: Obstetrics and Gynecology | Admitting: Obstetrics and Gynecology

## 2019-08-28 ENCOUNTER — Encounter (HOSPITAL_COMMUNITY): Payer: Self-pay | Admitting: Family Medicine

## 2019-08-28 ENCOUNTER — Other Ambulatory Visit: Payer: Self-pay

## 2019-08-28 ENCOUNTER — Ambulatory Visit (INDEPENDENT_AMBULATORY_CARE_PROVIDER_SITE_OTHER): Payer: BC Managed Care – PPO | Admitting: Family Medicine

## 2019-08-28 VITALS — BP 131/90 | HR 111 | Wt 291.0 lb

## 2019-08-28 DIAGNOSIS — D649 Anemia, unspecified: Secondary | ICD-10-CM | POA: Diagnosis present

## 2019-08-28 DIAGNOSIS — O9902 Anemia complicating childbirth: Secondary | ICD-10-CM | POA: Diagnosis present

## 2019-08-28 DIAGNOSIS — O139 Gestational [pregnancy-induced] hypertension without significant proteinuria, unspecified trimester: Secondary | ICD-10-CM

## 2019-08-28 DIAGNOSIS — Z3400 Encounter for supervision of normal first pregnancy, unspecified trimester: Secondary | ICD-10-CM

## 2019-08-28 DIAGNOSIS — Z3A12 12 weeks gestation of pregnancy: Secondary | ICD-10-CM

## 2019-08-28 DIAGNOSIS — Z20822 Contact with and (suspected) exposure to covid-19: Secondary | ICD-10-CM | POA: Diagnosis present

## 2019-08-28 DIAGNOSIS — O134 Gestational [pregnancy-induced] hypertension without significant proteinuria, complicating childbirth: Principal | ICD-10-CM | POA: Diagnosis present

## 2019-08-28 DIAGNOSIS — R030 Elevated blood-pressure reading, without diagnosis of hypertension: Secondary | ICD-10-CM

## 2019-08-28 DIAGNOSIS — Z3402 Encounter for supervision of normal first pregnancy, second trimester: Secondary | ICD-10-CM

## 2019-08-28 DIAGNOSIS — Z3A38 38 weeks gestation of pregnancy: Secondary | ICD-10-CM

## 2019-08-28 DIAGNOSIS — Z3689 Encounter for other specified antenatal screening: Secondary | ICD-10-CM

## 2019-08-28 DIAGNOSIS — Z3A39 39 weeks gestation of pregnancy: Secondary | ICD-10-CM | POA: Diagnosis not present

## 2019-08-28 LAB — CBC
HCT: 37.1 % (ref 36.0–46.0)
Hemoglobin: 11.8 g/dL — ABNORMAL LOW (ref 12.0–15.0)
MCH: 25.9 pg — ABNORMAL LOW (ref 26.0–34.0)
MCHC: 31.8 g/dL (ref 30.0–36.0)
MCV: 81.4 fL (ref 80.0–100.0)
Platelets: 184 10*3/uL (ref 150–400)
RBC: 4.56 MIL/uL (ref 3.87–5.11)
RDW: 14.6 % (ref 11.5–15.5)
WBC: 10.2 10*3/uL (ref 4.0–10.5)
nRBC: 0.2 % (ref 0.0–0.2)

## 2019-08-28 LAB — COMPREHENSIVE METABOLIC PANEL
ALT: 22 U/L (ref 0–44)
AST: 24 U/L (ref 15–41)
Albumin: 2.5 g/dL — ABNORMAL LOW (ref 3.5–5.0)
Alkaline Phosphatase: 147 U/L — ABNORMAL HIGH (ref 38–126)
Anion gap: 10 (ref 5–15)
BUN: 5 mg/dL — ABNORMAL LOW (ref 6–20)
CO2: 20 mmol/L — ABNORMAL LOW (ref 22–32)
Calcium: 9.1 mg/dL (ref 8.9–10.3)
Chloride: 111 mmol/L (ref 98–111)
Creatinine, Ser: 0.75 mg/dL (ref 0.44–1.00)
GFR calc Af Amer: >60 mL/min (ref >60)
GFR calc non Af Amer: >60 mL/min (ref >60)
Glucose, Bld: 128 mg/dL — ABNORMAL HIGH (ref 70–99)
Potassium: 4.1 mmol/L (ref 3.5–5.1)
Sodium: 141 mmol/L (ref 135–145)
Total Bilirubin: 0.3 mg/dL (ref 0.3–1.2)
Total Protein: 5.8 g/dL — ABNORMAL LOW (ref 6.5–8.1)

## 2019-08-28 LAB — URINALYSIS, ROUTINE W REFLEX MICROSCOPIC
Bilirubin Urine: NEGATIVE
Glucose, UA: NEGATIVE mg/dL
Hgb urine dipstick: NEGATIVE
Ketones, ur: NEGATIVE mg/dL
Nitrite: NEGATIVE
Protein, ur: 100 mg/dL — AB
Specific Gravity, Urine: 1.03 (ref 1.005–1.030)
pH: 6 (ref 5.0–8.0)

## 2019-08-28 LAB — TYPE AND SCREEN
ABO/RH(D): B POS
Antibody Screen: NEGATIVE

## 2019-08-28 LAB — PROTEIN / CREATININE RATIO, URINE
Creatinine, Urine: 366.04 mg/dL
Protein Creatinine Ratio: 0.19 mg/mg{Cre} — ABNORMAL HIGH (ref 0.00–0.15)
Total Protein, Urine: 69 mg/dL

## 2019-08-28 LAB — ABO/RH: ABO/RH(D): B POS

## 2019-08-28 MED ORDER — LIDOCAINE HCL (PF) 1 % IJ SOLN
30.0000 mL | INTRAMUSCULAR | Status: AC | PRN
  Administered 2019-08-29: 23:00:00 30 mL via SUBCUTANEOUS
  Filled 2019-08-28: qty 30

## 2019-08-28 MED ORDER — OXYTOCIN BOLUS FROM INFUSION
500.0000 mL | Freq: Once | INTRAVENOUS | Status: AC
  Administered 2019-08-29: 23:00:00 500 mL via INTRAVENOUS

## 2019-08-28 MED ORDER — OXYCODONE-ACETAMINOPHEN 5-325 MG PO TABS: 2.0000 | ORAL_TABLET | ORAL | Status: DC | PRN

## 2019-08-28 MED ORDER — ONDANSETRON HCL 4 MG/2ML IJ SOLN: 4.0000 mg | Freq: Four times a day (QID) | INTRAMUSCULAR | Status: DC | PRN

## 2019-08-28 MED ORDER — OXYCODONE-ACETAMINOPHEN 5-325 MG PO TABS: 1.0000 | ORAL_TABLET | ORAL | Status: DC | PRN

## 2019-08-28 MED ORDER — LACTATED RINGERS IV SOLN: INTRAVENOUS | Status: DC

## 2019-08-28 MED ORDER — LACTATED RINGERS IV SOLN
500.0000 mL | INTRAVENOUS | Status: DC | PRN
  Administered 2019-08-29: 11:00:00 500 mL via INTRAVENOUS
  Administered 2019-08-30: 01:00:00 1000 mL via INTRAVENOUS

## 2019-08-28 MED ORDER — SOD CITRATE-CITRIC ACID 500-334 MG/5ML PO SOLN: 30.0000 mL | ORAL | Status: DC | PRN

## 2019-08-28 MED ORDER — TERBUTALINE SULFATE 1 MG/ML IJ SOLN: 0.2500 mg | Freq: Once | INTRAMUSCULAR | Status: DC | PRN

## 2019-08-28 MED ORDER — BUTALBITAL-APAP-CAFFEINE 50-325-40 MG PO TABS
2.0000 | ORAL_TABLET | Freq: Once | ORAL | Status: AC
  Administered 2019-08-28: 17:00:00 2 via ORAL
  Filled 2019-08-28: qty 2

## 2019-08-28 MED ORDER — ACETAMINOPHEN 325 MG PO TABS
650.0000 mg | ORAL_TABLET | ORAL | Status: DC | PRN
  Administered 2019-08-29: 13:00:00 650 mg via ORAL
  Filled 2019-08-28: qty 2

## 2019-08-28 MED ORDER — OXYTOCIN 40 UNITS IN NORMAL SALINE INFUSION - SIMPLE MED
2.5000 [IU]/h | INTRAVENOUS | Status: DC
  Filled 2019-08-28: qty 1000

## 2019-08-28 MED ORDER — MISOPROSTOL 25 MCG QUARTER TABLET
25.0000 ug | ORAL_TABLET | ORAL | Status: DC | PRN
  Administered 2019-08-28 – 2019-08-29 (×2): 25 ug via VAGINAL
  Filled 2019-08-28 (×2): qty 1

## 2019-08-28 NOTE — MAU Provider Note (Signed)
Patient Kristin Zimmerman is a 24 y.o. G1P0 At [redacted]w[redacted]d here for rule out of pre-e and GHTN. She was seen at [facility] for an in person visit today and was found to have elevated blood pressures. She also endorses a HA for the past two weeks. The HA is worse in the morning but she takes Tylenol at home and it gets better. She missed her last appt so she did not tell anyone about her HA. She is not taking her blood pressure at home.  History     CSN: [id number]  Arrival date and time: 08/28/19 1547   None     Chief Complaint  Patient presents with  . Headache  . Hypertension  . Decreased Fetal Movement   Headache  This is a new problem. The current episode started 1 to 4 weeks ago. The problem occurs intermittently. The pain is located in the right unilateral region. The pain radiates to the right neck. The pain quality is not similar to prior headaches. The pain is at a severity of 10/10. Pertinent negatives include no blurred vision, phonophobia, vomiting or weakness. She has tried acetaminophen for the symptoms. Improvement on treatment: she takes tylenol twice a day for her headache, and that usually makes it better.  Her past medical history is significant for hypertension.  Hypertension Associated symptoms include headaches. Pertinent negatives include no blurred vision.    OB History    Gravida  1   Para      Term      Preterm      AB      Living        SAB      TAB      Ectopic      Multiple      Live Births              Past Medical History:  Diagnosis Date  . Medical history non-contributory     Past Surgical History:  Procedure Laterality Date  . RHINOPLASTY      History reviewed. No pertinent family history.  Social History   Tobacco Use  . Smoking status: Never Smoker  . Smokeless tobacco: Never Used  Substance Use Topics  . Alcohol use: Never  . Drug use: Never    Allergies:  Allergies  Allergen Reactions  . Other  Nausea And Vomiting    Lobster    Medications Prior to Admission  Medication Sig Dispense Refill Last Dose  . Prenatal Vit-Fe Phos-FA-Omega (VITAFOL GUMMIES) 3.33-0.333-34.8 MG CHEW Chew 34.8 mg by mouth daily. 90 tablet 10 08/28/2019 at Unknown time  . montelukast (SINGULAIR) 10 MG tablet Take by mouth.     . Prenatal Vit-Fe Fumarate-FA (PRENATAL VITAMINS PO) Take by mouth.     . Prenatal Vit-Min-FA-Fish Oil ([employer] PRENATAL GUMMY) 0.4-113.5 MG CHEW Chew by mouth.       Review of Systems  HENT: Negative.   Eyes: Negative for blurred vision.  Respiratory: Negative.   Cardiovascular: Negative.   Gastrointestinal: Negative for vomiting.  Musculoskeletal: Negative.   Neurological: Positive for headaches. Negative for weakness.   Physical Exam   Blood pressure 140/83, pulse (!) 112, temperature 98.8 F (37.1 C), temperature source Oral, resp. rate 16, height 5\' 10"  (1.778 m), weight 130.8 kg, last menstrual period 11/29/2018, SpO2 100 %.  Physical Exam  Constitutional: She is oriented to person, place, and time. She appears well-developed.  HENT:  Head: Normocephalic.  Respiratory: Effort normal.  GI: Soft.  Musculoskeletal:        General: Normal range of motion.     Cervical back: Normal range of motion.  Neurological: She is alert and oriented to person, place, and time.  Skin: Skin is warm and dry.    MAU Course  Procedures  MDM -Patient's BP is mildly elevated, but she endorses HA. Will draw pre-e labs and treat HA with fioricet -Patient feels better, HA is now 2/10 whereas before it was a 10/10.  -CBC and CMP are normal.   Patient Vitals for the past 24 hrs:  BP Temp Temp src Pulse Resp SpO2 Height Weight  08/28/19 1900 140/83 -- -- (!) 112 -- 100 % -- --  08/28/19 1845 139/89 -- -- (!) 107 -- 100 % -- --  08/28/19 1830 (!) 141/82 -- -- (!) 105 -- 100 % -- --  08/28/19 1815 136/88 -- -- (!) 104 -- 100 % -- --  08/28/19 1800 (!) 144/98 -- -- (!) 102 -- 100 % -- --   08/28/19 1745 (!) 144/85 -- -- (!) 105 -- 100 % -- --  08/28/19 1715 (!) 146/96 -- -- (!) 102 -- 100 % -- --  08/28/19 1700 (!) 146/92 -- -- (!) 101 -- 100 % -- --  08/28/19 1645 (!) 143/94 -- -- (!) 110 -- 98 % -- --  08/28/19 1628 135/86 -- -- (!) 117 -- -- -- --  08/28/19 1608 138/74 98.8 F (37.1 C) Oral (!) 117 16 98 % -- --  08/28/19 1603 -- -- -- -- -- -- 5\' 10"  (1.778 m) 130.8 kg     Assessment and Plan  -Patient continues to have elevated blood pressures, although HA is improved with Fioricet. -Discussed with labor team, and will admit for induction for GHTN.   Laurencia Roma 08/28/2019, 7:31 PM

## 2019-08-28 NOTE — MAU Note (Signed)
Covid swab obtained without difficulty and pt tol well. No symptoms 

## 2019-08-28 NOTE — Progress Notes (Signed)
Kyle Stansell is a 24 y.o. G1P0 at [redacted]w[redacted]d admitted for induction of labor due to new onset gestational hypertension.  Subjective: Resting comfortably, feeling baby move.   Objective: BP (!) 143/77   Pulse 95   Temp 98.4 F (36.9 C) (Oral)   Resp 16   Ht 5\' 10"  (1.778 m)   Wt 130.8 kg   LMP 11/29/2018 (Exact Date)   SpO2 100%   BMI 41.37 kg/m  No intake/output data recorded.  FHT:  FHR: 135 bpm, variability: moderate,  accelerations:  Present,  decelerations:  Absent UC:   None, uterine irritability  SVE:   Dilation: Closed Effacement (%): Thick Station: Ballotable Exam by:: [email or web address] RN  Labs: Lab Results  Component Value Date   WBC 10.2 08/28/2019   HGB 11.8 (L) 08/28/2019   HCT 37.1 08/28/2019   MCV 81.4 08/28/2019   PLT 184 08/28/2019    Assessment / Plan: 24 yo G1P0 here for IOL 2/2 new onset gHTN  Labor: S/p cyto x1. Will attempt FB at next check.  Risks and benefits of induction were reviewed, including failure of method, prolonged labor, need for further intervention, risk of cesarean.  Patient and family seem to understand these risks and wish to proceed. Options of cytotec, foley bulb, AROM, and pitocin reviewed, with use of each discussed.  Fetal Wellbeing:  Category I Pain Control:  per patient request Gestational hypertension: Pre-E labs normal, P:Cr 0.19. No symptoms. No severe range BPs I/D:  GBS negative Anticipated MOD:  vaginal, CS as appropriate  30 DO OB Fellow, Faculty Practice 08/28/2019, 10:30 PM

## 2019-08-28 NOTE — H&P (Signed)
Kristin Zimmerman is a 24 y.o. female G1P0 with IUP at [redacted]w[redacted]d presenting for IOL for new onset GHTN. She was seen at her prenatal visit today and found to have elevated blood pressures. Her blood pressures were still elevated in MAU (none severe range) and given that patient has had on-agin/off-again headache, decision was made to admit. HA did reduce from 10/10 to 2/10 in MAU with Fioricet today.    Pt states she has been having none contractions, associated with none vaginal bleeding for no hours..  Membranes are intact, with active fetal movement.   [facility] at [facility] wks  Prenatal History/Complications:  Past Medical History: Past Medical History:  Diagnosis Date  . Medical history non-contributory     Past Surgical History: Past Surgical History:  Procedure Laterality Date  . RHINOPLASTY      Obstetrical History: OB History    Gravida  1   Para      Term      Preterm      AB      Living        SAB      TAB      Ectopic      Multiple      Live Births               Social History: Social History   Socioeconomic History  . Marital status: Single    Spouse name: Not on file  . Number of children: Not on file  . Years of education: Not on file  . Highest education level: Not on file  Occupational History  . Not on file  Tobacco Use  . Smoking status: Never Smoker  . Smokeless tobacco: Never Used  Substance and Sexual Activity  . Alcohol use: Never  . Drug use: Never  . Sexual activity: Not Currently    Birth control/protection: None  Other Topics Concern  . Not on file  Social History Narrative   Is from [place]   Is here to finish college for Finance (wants to be a CFP)   Boyfriend is from here   She has some family here   Had early care at [facility], is planning to move to [place] area   Social Determinants of Health   Financial Resource Strain:   . Difficulty of Paying Living Expenses: Not on file  Food Insecurity:    . Worried About Programme researcher, broadcasting/film/video in the Last Year: Not on file  . Ran Out of Food in the Last Year: Not on file  Transportation Needs:   . Lack of Transportation (Medical): Not on file  . Lack of Transportation (Non-Medical): Not on file  Physical Activity:   . Days of Exercise per Week: Not on file  . Minutes of Exercise per Session: Not on file  Stress:   . Feeling of Stress : Not on file  Social Connections:   . Frequency of Communication with Friends and Family: Not on file  . Frequency of Social Gatherings with Friends and Family: Not on file  . Attends Religious Services: Not on file  . Active Member of Clubs or Organizations: Not on file  . Attends Banker Meetings: Not on file  . Marital Status: Not on file    Family History: History reviewed. No pertinent family history.  Allergies: Allergies  Allergen Reactions  . Other Nausea And Vomiting    Lobster    Medications Prior to Admission  Medication  Sig Dispense Refill Last Dose  . Prenatal Vit-Fe Phos-FA-Omega (VITAFOL GUMMIES) 3.33-0.333-34.8 MG CHEW Chew 34.8 mg by mouth daily. 90 tablet 10 08/28/2019 at Unknown time  . montelukast (SINGULAIR) 10 MG tablet Take by mouth.     . Prenatal Vit-Fe Fumarate-FA (PRENATAL VITAMINS PO) Take by mouth.     . Prenatal Vit-Min-FA-Fish Oil ([facility] PRENATAL GUMMY) 0.4-113.5 MG CHEW Chew by mouth.           Review of Systems   Constitutional: Negative for fever and chills Eyes: Negative for visual disturbances Respiratory: Negative for shortness of breath, dyspnea Cardiovascular: Negative for chest pain or palpitations  Gastrointestinal: Negative for vomiting, diarrhea and constipation.  POSITIVE for abdominal pain (contractions) Genitourinary: Negative for dysuria and urgency Musculoskeletal: Negative for back pain, joint pain, myalgias  Neurological: Negative for dizziness and headaches      Blood pressure 140/83, pulse (!) 112, temperature 98.8 F  (37.1 C), temperature source Oral, resp. rate 16, height 5\' 10"  (1.778 m), weight 130.8 kg, last menstrual period 11/29/2018, SpO2 100 %. General appearance: alert, cooperative and no distress Lungs: normal respiratory effort Heart: regular rate and rhythm Abdomen: soft, non-tender; bowel sounds normal Extremities: Homans sign is negative, no sign of DVT DTR's 2+ Presentation: cephalic Fetal monitoring  Baseline: 150 bpm Uterine activity  Patient has irregular contractions that she does not feel.      Prenatal labs: ABO, Rh: B/Positive/-- (10/21 04-09-1984) Antibody: Negative (10/21 0928) Rubella: 7.17 (10/21 0928) RPR: Non Reactive (10/21 0928)  HBsAg: Negative (10/21 04-09-1984)  HIV: Non Reactive (10/21 04-09-1984)  GBS: Negative/-- (12/18 1015)  1 hr Glucola passed  Genetic screening  Negative screen Anatomy 04-04-1996 Normal, follow up normal  Prenatal Transfer Tool  Maternal Diabetes: No Genetic Screening: Normal Maternal Ultrasounds/Referrals: Normal Fetal Ultrasounds or other Referrals:  None Maternal Substance Abuse:  No Significant Maternal Medications:  None Significant Maternal Lab Results: Group B Strep negative     Results for orders placed or performed during the hospital encounter of 08/28/19 (from the past 24 hour(s))  Urinalysis, Routine w reflex microscopic   Collection Time: 08/28/19  4:18 PM  Result Value Ref Range   Color, Urine AMBER (A) YELLOW   APPearance CLOUDY (A) CLEAR   Specific Gravity, Urine 1.030 1.005 - 1.030   pH 6.0 5.0 - 8.0   Glucose, UA NEGATIVE NEGATIVE mg/dL   Hgb urine dipstick NEGATIVE NEGATIVE   Bilirubin Urine NEGATIVE NEGATIVE   Ketones, ur NEGATIVE NEGATIVE mg/dL   Protein, ur 10/26/19 (A) NEGATIVE mg/dL   Nitrite NEGATIVE NEGATIVE   Leukocytes,Ua MODERATE (A) NEGATIVE   RBC / HPF 0-5 0 - 5 RBC/hpf   WBC, UA 11-20 0 - 5 WBC/hpf   Bacteria, UA RARE (A) NONE SEEN   Squamous Epithelial / LPF 21-50 0 - 5   Mucus PRESENT    Budding Yeast PRESENT     Ca Oxalate Crys, UA PRESENT   Protein / creatinine ratio, urine   Collection Time: 08/28/19  4:34 PM  Result Value Ref Range   Creatinine, Urine 366.04 mg/dL   Total Protein, Urine 69 mg/dL   Protein Creatinine Ratio 0.19 (H) 0.00 - 0.15 mg/mg[Cre]  CBC   Collection Time: 08/28/19  5:01 PM  Result Value Ref Range   WBC 10.2 4.0 - 10.5 K/uL   RBC 4.56 3.87 - 5.11 MIL/uL   Hemoglobin 11.8 (L) 12.0 - 15.0 g/dL   HCT 10/26/19 18.2 - 99.3 %   MCV 81.4 80.0 -  100.0 fL   MCH 25.9 (L) 26.0 - 34.0 pg   MCHC 31.8 30.0 - 36.0 g/dL   RDW 14.6 11.5 - 15.5 %   Platelets 184 150 - 400 K/uL   nRBC 0.2 0.0 - 0.2 %  Comprehensive metabolic panel   Collection Time: 08/28/19  5:01 PM  Result Value Ref Range   Sodium 141 135 - 145 mmol/L   Potassium 4.1 3.5 - 5.1 mmol/L   Chloride 111 98 - 111 mmol/L   CO2 20 (L) 22 - 32 mmol/L   Glucose, Bld 128 (H) 70 - 99 mg/dL   BUN 5 (L) 6 - 20 mg/dL   Creatinine, Ser 0.75 0.44 - 1.00 mg/dL   Calcium 9.1 8.9 - 10.3 mg/dL   Total Protein 5.8 (L) 6.5 - 8.1 g/dL   Albumin 2.5 (L) 3.5 - 5.0 g/dL   AST 24 15 - 41 U/L   ALT 22 0 - 44 U/L   Alkaline Phosphatase 147 (H) 38 - 126 U/L   Total Bilirubin 0.3 0.3 - 1.2 mg/dL   GFR calc non Af Amer >60 >60 mL/min   GFR calc Af Amer >60 >60 mL/min   Anion gap 10 5 - 15    Assessment: Kristin Zimmerman is a 24 y.o. G1P0 with an IUP at [redacted]w[redacted]d presenting for IOL after recent diagnosis of GHTN at 38.6 weeks. Continue to monitor HA and treat as necessary. Pre-e labs are normal.   Plan: #Labor: expectant management #Pain:  Per request #FWB Cat 1 #ID: GBS: negative  #MOF:  breast #MOC: considering nexplanon.  #Circ: yes    Starr Lake 08/28/2019, 7:39 PM

## 2019-08-28 NOTE — Progress Notes (Signed)
   PRENATAL VISIT NOTE  Subjective:  Kristin Zimmerman is a 24 y.o. G1P0 at 110w6d being seen today for ongoing prenatal care.  She is currently monitored for the following issues for this low-risk pregnancy and has Supervision of normal first pregnancy, antepartum on their problem list.  Patient reports intermittent severe HA, gets better with food and medicine. No vision changes.  Contractions: Not present. Vag. Bleeding: Scant.  Movement: Present. Denies leaking of fluid.   The following portions of the patient's history were reviewed and updated as appropriate: allergies, current medications, past family history, past medical history, past social history, past surgical history and problem list.   Objective:   Vitals:   08/28/19 1431 08/28/19 1446  BP: (!) 127/97 131/90  Pulse: (!) 119 (!) 111  Weight: 291 lb (132 kg)     Fetal Status: Fetal Heart Rate (bpm): 147   Movement: Present     General:  Alert, oriented and cooperative. Patient is in no acute distress.  Skin: Skin is warm and dry. No rash noted.   Cardiovascular: Normal heart rate noted  Respiratory: Normal respiratory effort, no problems with respiration noted  Abdomen: Soft, gravid, appropriate for gestational age.  Pain/Pressure: Present     Pelvic: Cervical exam deferred        Extremities: Normal range of motion.  Edema: Trace  Mental Status: Normal mood and affect. Normal behavior. Normal judgment and thought content.   Assessment and Plan:  Pregnancy: G1P0 at [redacted]w[redacted]d  1. Supervision of normal first pregnancy, antepartum FHT and FH normal  2. Elevated BP without diagnosis of hypertension Will send patient to MAU for serial BP and labs  Preterm labor symptoms and general obstetric precautions including but not limited to vaginal bleeding, contractions, leaking of fluid and fetal movement were reviewed in detail with the patient. Please refer to After Visit Summary for other counseling recommendations.   Return in  about 1 week (around 09/04/2019) for In Office, OB f/u.  No future appointments.  Levie Heritage, DO

## 2019-08-28 NOTE — MAU Note (Signed)
Kristin Zimmerman is a 24 y.o. at [redacted]w[redacted]d here in MAU reporting: was at the office today and was sent over for increased BP. States her hands and feet are swollen. Also has a headache. No visual changes at this time. Sometimes has sharp RUQ pain. Saw some bleeding yesterday but none today. Decreased fetal movement.  Onset of complaint: ongoing  Pain score: headache 3/10  Vitals:   08/28/19 1608  BP: 138/74  Pulse: (!) 117  Resp: 16  Temp: 98.8 F (37.1 C)  SpO2: 98%     FHT:132  Lab orders placed from triage: UA

## 2019-08-29 ENCOUNTER — Inpatient Hospital Stay (HOSPITAL_COMMUNITY): Payer: BC Managed Care – PPO | Admitting: Anesthesiology

## 2019-08-29 LAB — RPR: RPR Ser Ql: NONREACTIVE

## 2019-08-29 LAB — CBC
HCT: 35.5 % — ABNORMAL LOW (ref 36.0–46.0)
Hemoglobin: 11.1 g/dL — ABNORMAL LOW (ref 12.0–15.0)
MCH: 25.4 pg — ABNORMAL LOW (ref 26.0–34.0)
MCHC: 31.3 g/dL (ref 30.0–36.0)
MCV: 81.2 fL (ref 80.0–100.0)
Platelets: 175 10*3/uL (ref 150–400)
RBC: 4.37 MIL/uL (ref 3.87–5.11)
RDW: 14.4 % (ref 11.5–15.5)
WBC: 13.5 10*3/uL — ABNORMAL HIGH (ref 4.0–10.5)
nRBC: 0 % (ref 0.0–0.2)

## 2019-08-29 LAB — SARS CORONAVIRUS 2 (TAT 6-24 HRS): SARS Coronavirus 2: NEGATIVE

## 2019-08-29 MED ORDER — MISOPROSTOL 50MCG HALF TABLET
50.0000 ug | ORAL_TABLET | ORAL | Status: DC
  Administered 2019-08-29 (×2): 50 ug via BUCCAL
  Filled 2019-08-29: qty 1

## 2019-08-29 MED ORDER — FENTANYL CITRATE (PF) 100 MCG/2ML IJ SOLN
100.0000 ug | INTRAMUSCULAR | Status: DC | PRN
  Administered 2019-08-29: 06:00:00 100 ug via INTRAVENOUS
  Filled 2019-08-29: qty 2

## 2019-08-29 MED ORDER — FENTANYL CITRATE (PF) 100 MCG/2ML IJ SOLN
INTRAMUSCULAR | Status: AC
  Administered 2019-08-29: 09:00:00 100 ug via INTRAVENOUS
  Filled 2019-08-29: qty 2

## 2019-08-29 MED ORDER — MISOPROSTOL 50MCG HALF TABLET
ORAL_TABLET | ORAL | Status: AC
  Filled 2019-08-29: qty 1

## 2019-08-29 MED ORDER — SODIUM CHLORIDE (PF) 0.9 % IJ SOLN
INTRAMUSCULAR | Status: DC | PRN
  Administered 2019-08-29: 12 mL/h via EPIDURAL

## 2019-08-29 MED ORDER — OXYTOCIN 40 UNITS IN NORMAL SALINE INFUSION - SIMPLE MED
1.0000 m[IU]/min | INTRAVENOUS | Status: DC
  Administered 2019-08-29: 10:00:00 2 m[IU]/min via INTRAVENOUS

## 2019-08-29 MED ORDER — PHENYLEPHRINE 40 MCG/ML (10ML) SYRINGE FOR IV PUSH (FOR BLOOD PRESSURE SUPPORT)
80.0000 ug | PREFILLED_SYRINGE | INTRAVENOUS | Status: DC | PRN
  Administered 2019-08-29 (×2): 80 ug via INTRAVENOUS

## 2019-08-29 MED ORDER — TERBUTALINE SULFATE 1 MG/ML IJ SOLN: 0.2500 mg | Freq: Once | INTRAMUSCULAR | Status: DC | PRN

## 2019-08-29 MED ORDER — DIPHENHYDRAMINE HCL 50 MG/ML IJ SOLN: 12.5000 mg | INTRAMUSCULAR | Status: DC | PRN

## 2019-08-29 MED ORDER — LACTATED RINGERS IV SOLN
500.0000 mL | Freq: Once | INTRAVENOUS | Status: AC
  Administered 2019-08-29: 500 mL via INTRAVENOUS

## 2019-08-29 MED ORDER — EPHEDRINE 5 MG/ML INJ: 10.0000 mg | INTRAVENOUS | Status: DC | PRN

## 2019-08-29 MED ORDER — FENTANYL-BUPIVACAINE-NACL 0.5-0.125-0.9 MG/250ML-% EP SOLN: 12.0000 mL/h | EPIDURAL | Status: DC | PRN

## 2019-08-29 MED ORDER — FENTANYL-BUPIVACAINE-NACL 0.5-0.125-0.9 MG/250ML-% EP SOLN
12.0000 mL/h | EPIDURAL | Status: DC | PRN
  Filled 2019-08-29: qty 250

## 2019-08-29 MED ORDER — FENTANYL CITRATE (PF) 100 MCG/2ML IJ SOLN
INTRAMUSCULAR | Status: AC
  Filled 2019-08-29: qty 2

## 2019-08-29 MED ORDER — PHENYLEPHRINE 40 MCG/ML (10ML) SYRINGE FOR IV PUSH (FOR BLOOD PRESSURE SUPPORT)
80.0000 ug | PREFILLED_SYRINGE | INTRAVENOUS | Status: DC | PRN
  Filled 2019-08-29: qty 10

## 2019-08-29 MED ORDER — FENTANYL CITRATE (PF) 100 MCG/2ML IJ SOLN
50.0000 ug | Freq: Once | INTRAMUSCULAR | Status: AC
  Administered 2019-08-29: 01:00:00 50 ug via INTRAVENOUS

## 2019-08-29 MED ORDER — LIDOCAINE HCL (PF) 1 % IJ SOLN
INTRAMUSCULAR | Status: DC | PRN
  Administered 2019-08-29: 8 mL via EPIDURAL

## 2019-08-29 NOTE — Progress Notes (Signed)
Kristin Zimmerman is a 24 y.o. G1P0 at [redacted]w[redacted]d admitted for induction of labor due to new onset gestational hypertension.  Subjective: Feeling some cramping with contractions. Feeling baby move. No headache, SOB, or RUQ pain.  Objective: BP (!) 157/92   Pulse (!) 105   Temp 98.4 F (36.9 C) (Oral)   Resp 18   Ht 5\' 10"  (1.778 m)   Wt 130.8 kg   LMP 11/29/2018 (Exact Date)   SpO2 100%   BMI 41.37 kg/m  No intake/output data recorded.  FHT:  FHR: 150 bpm, variability: moderate,  accelerations:  Present,  decelerations:  Absent UC:   regular, every 2-3 minutes  SVE:   Dilation: 1 Effacement (%): Thick Station: -3 Exam by:: [email or web address]  Labs: Lab Results  Component Value Date   WBC 10.2 08/28/2019   HGB 11.8 (L) 08/28/2019   HCT 37.1 08/28/2019   MCV 81.4 08/28/2019   PLT 184 08/28/2019    Assessment / Plan: 24 yo G1P0 here for IOL 2/2 new onset gHTN  Labor: S/p cyto x2. FB placed at 0113. Consider more cyto. Pitocin and AROM when appropriate. Fetal Wellbeing:  Category I Pain Control:  per patient request Gestational hypertension: Pre-E labs normal, P:Cr 0.19. No symptoms. No severe range BPs I/D:  GBS negative Anticipated MOD:  vaginal, CS as appropriate  30 DO OB Fellow, Faculty [employer] 08/29/2019, 2:45 AM

## 2019-08-29 NOTE — Progress Notes (Addendum)
Kristin Zimmerman is a 24 y.o. G1P0 at [redacted]w[redacted]d by ultrasound admitted for induction of labor due to gestational Hypertension.  Subjective: Doing well, having some cramping and would like an epidural.     Objective: BP (!) 142/76   Pulse (!) 124   Temp 98.9 F (37.2 C) (Oral)   Resp 18   Ht 5\' 10"  (1.778 m)   Wt 288 lb 4.8 oz (130.8 kg)   LMP 11/29/2018 (Exact Date)   SpO2 100%   BMI 41.37 kg/m  No intake/output data recorded. No intake/output data recorded.  FHT:  FHR: 150 bpm, variability: moderate,  accelerations:  Present,  decelerations:  Absent UC:   irregular, every 2-6 minutes SVE:   Dilation: 5 Effacement (%): 80 Station: -2 Exam by:: stone rnc  Labs: Lab Results  Component Value Date   WBC 13.5 (H) 08/29/2019   HGB 11.1 (L) 08/29/2019   HCT 35.5 (L) 08/29/2019   MCV 81.2 08/29/2019   PLT 175 08/29/2019    Assessment / Plan: Induction of labor due to gestational hypertension  Labor: Progressing normally, Latent Fetal Wellbeing:  Category I Pain Control:  Epidural I/D:  n/a Anticipated MOD:  NSVD  Leeroy Cha SNM 08/29/2019, 10:46 AM   I confirm that I have verified the information documented in the nurse midwife student's note and that I have also personally reperformed the history, physical exam and all medical decision making activities of this service and have verified that all service and findings are accurately documented in this student's note.   -patient denies signs of pre-e at this time, BPs have been mildly elevated so far with two non-repetitive severe range pressures at 930 and 1030. Discussed elevated pressures with RN, who states that cuff was not placed properly and will readjust and notify provider if pressures continues to be severe.  -Plan to start pitocin after epidural placement  -Patient Vitals for the past 24 hrs:  BP Temp Temp src Pulse Resp SpO2 Height Weight  08/29/19 1045 (!) 142/76 - - (!) 124 18 100 % - -  08/29/19 1040 129/65 -  - (!) 124 - 100 % - -  08/29/19 1035 (!) 148/71 - - (!) 136 17 100 % - -  08/29/19 1031 (!) 161/71 - - (!) 123 - 100 % - -  08/29/19 1022 (!) 156/82 - - (!) 131 - - - -  08/29/19 0930 (!) 160/85 98.9 F (37.2 C) Oral (!) 116 - - - -  08/29/19 0900 (!) 155/77 - - (!) 112 18 - - -  08/29/19 0801 (!) 148/80 - - (!) 121 - - - -  08/29/19 0700 136/62 - - (!) 112 16 - - -  08/29/19 0600 131/79 98.7 F (37.1 C) Oral (!) 122 18 - - -  08/29/19 0500 113/60 98.7 F (37.1 C) Oral (!) 125 16 - - -  08/29/19 0409 134/79 - - (!) 119 16 - - -  08/29/19 0300 (!) 148/102 - - (!) 123 16 - - -  08/29/19 0212 125/67 98.2 F (36.8 C) Oral (!) 108 16 - - -  08/29/19 0134 (!) 157/92 - - (!) 105 16 - - -  08/29/19 0113 (!) 160/109 - - (!) 107 - - - -  08/29/19 0000 (!) 115/52 - - (!) 109 18 - - -  08/28/19 2314 (!) 153/81 - - (!) 115 - - - -  08/28/19 2300 (!) 160/87 - - (!) 116 18 - - -  08/28/19 2237 (!) 159/100 - - (!) 119 18 - - -  08/28/19 2121 (!) 143/77 - - 95 16 - - -  08/28/19 2010 (!) 150/90 98.4 F (36.9 C) Oral (!) 110 - - - -  08/28/19 1915 140/83 - - (!) 112 - 100 % - -  08/28/19 1900 140/83 - - (!) 112 - 100 % - -  08/28/19 1845 139/89 - - (!) 107 - 100 % - -  08/28/19 1830 (!) 141/82 - - (!) 105 - 100 % - -  08/28/19 1815 136/88 - - (!) 104 - 100 % - -  08/28/19 1800 (!) 144/98 - - (!) 102 - 100 % - -  08/28/19 1745 (!) 144/85 - - (!) 105 - 100 % - -  08/28/19 1715 (!) 146/96 - - (!) 102 - 100 % - -  08/28/19 1700 (!) 146/92 - - (!) 101 - 100 % - -  08/28/19 1645 (!) 143/94 - - (!) 110 - 98 % - -  08/28/19 1628 135/86 - - (!) 117 - - - -  08/28/19 1608 138/74 98.8 F (37.1 C) Oral (!) 117 16 98 % - -  08/28/19 1603 - - - - - - 5\' 10"  (1.778 m) 288 lb 4.8 oz (130.8 kg)    , CNM 08/29/2019 10:47 AM

## 2019-08-29 NOTE — Progress Notes (Addendum)
Kristin Zimmerman is a 24 y.o. G1P0 at [redacted]w[redacted]d by ultrasound admitted for induction of labor due to gestational hypertension.  Subjective: Resting in bed, comfortable, no complaints.   Objective: BP (!) 146/72   Pulse (!) 124   Temp 99 F (37.2 C) (Oral)   Resp 18   Ht 5\' 10"  (1.778 m)   Wt 130.8 kg   LMP 11/29/2018 (Exact Date)   SpO2 100%   BMI 41.37 kg/m  No intake/output data recorded. Total I/O In: -  Out: 325 [Urine:325]  FHT:  FHR: 145 bpm, variability: minimal ,  accelerations:  Abscent,  decelerations:  Absent UC:   regular, every 2-4 minutes via IUPC, MVU's: 65 SVE:   Dilation: 6 Effacement (%): 70 Station: -2 Exam by:: Halia Franey cnm   Labs: Lab Results  Component Value Date   WBC 13.5 (H) 08/29/2019   HGB 11.1 (L) 08/29/2019   HCT 35.5 (L) 08/29/2019   MCV 81.2 08/29/2019   PLT 175 08/29/2019    Assessment / Plan: Induction of labor due to gestational hypertension,  progressing well on pitocin  -Re-start pitocin at 2 milli/units and assess fetal tolerance. -Increase pitocin by 2 milli/units every 30 minutes as tolerated to achieve contractions of every 2-3 minutes that are moderate in strength and/or to achieve MVU's of 200. -Blood pressures in the normal range post epidural, will continue to monitor for sever range pressures.  No s/s of pre-e.  Labor: Progressing on Pitocin Fetal Wellbeing:  Category I Pain Control:  Epidural I/D:  n/a Anticipated MOD:  NSVD  10/27/2019, SNM 08/29/2019, 1:31 PM   I confirm that I have verified the information documented in the nurse midwife student's note and that I have also personally reperformed the history, physical exam and all medical decision making activities of this service and have verified that all service and findings are accurately documented in this student's note.   10/27/2019, CNM 08/29/2019 4:11 PM

## 2019-08-29 NOTE — Progress Notes (Addendum)
Kristin Zimmerman is a 24 y.o. G1P0 at [redacted]w[redacted]d by ultrasound admitted for induction of labor due to gestational Hypertension.  Subjective: Resting in bed on right side with peanut ball, no complaints. Denies HA and visual disturbances.   Objective: BP (!) 146/79   Pulse (!) 121   Temp 98.7 F (37.1 C) (Oral)   Resp 18   Ht 5\' 10"  (1.778 m)   Wt 130.8 kg   LMP 11/29/2018 (Exact Date)   SpO2 100%   BMI 41.37 kg/m  No intake/output data recorded. Total I/O In: -  Out: 1175 [Urine:1175]  FHT:  FHR: 135 bpm, variability: moderate,  accelerations:  Abscent,  decelerations:  Absent UC:   regular, every 1-3 minutes via IUPC, MVU's: 85 SVE:   Dilation: 6 Effacement (%): 70 Station: -2 Exam by:: Averly Ericson cnm  Labs: Lab Results  Component Value Date   WBC 13.5 (H) 08/29/2019   HGB 11.1 (L) 08/29/2019   HCT 35.5 (L) 08/29/2019   MCV 81.2 08/29/2019   PLT 175 08/29/2019    Assessment / Plan: induction of labor due to gestational hypertension.  -Continue to increase by 2 milli/units every 30 minutes as tolerated to achieve contractions of every 2-3 minutes that are moderate in strength and/or to achieve MVU's of 200. -Blood pressures in the normal range post epidural, will continue to monitor for sever range pressures.  No s/s of pre-e.   Labor: Progressing normally Fetal Wellbeing:  Category I Pain Control:  Epidural I/D:  n/a Anticipated MOD:  NSVD  10/27/2019, SNM 08/29/2019, 4:11 PM   I confirm that I have verified the information documented in the nurse midwife student's note and that I have also personally reperformed the history, physical exam and all medical decision making activities of this service and have verified that all service and findings are accurately documented in this student's note.     10/27/2019, CNM 08/29/2019 6:01 PM

## 2019-08-29 NOTE — Progress Notes (Signed)
   Kristin Zimmerman is a 24 y.o. G1P0 at [redacted]w[redacted]d  admitted for induction of labor due to gestational hypertension.  Subjective: Feeling some abdominal pain, otherwise watching tv and resting in bed.   Objective: Vitals:   08/29/19 1800 08/29/19 1830 08/29/19 1900 08/29/19 1931  BP: 117/63 (!) 130/92 (!) 149/89 (!) 133/52  Pulse: (!) 112 (!) 121 (!) 136 (!) 124  Resp:  18 20 19   Temp:  98.8 F (37.1 C)    TempSrc:  Oral    SpO2:      Weight:      Height:       No intake/output data recorded.  FHT:  FHR: 150 bpm, variability: moderate,  accelerations:  Present,  decelerations:  Absent UC:   irregular, every 1-2 minutes SVE:   Dilation: 10 Effacement (%): 80, 90 Station: 0 Exam by:: amanda Hallman SNM Pitocin @ 16 mu/min  Labs: Lab Results  Component Value Date   WBC 13.5 (H) 08/29/2019   HGB 11.1 (L) 08/29/2019   HCT 35.5 (L) 08/29/2019   MCV 81.2 08/29/2019   PLT 175 08/29/2019    Assessment / Plan: IOL now at 10 cm per SNM exam. BPs are mildly elevated; patient continues to be asymptomatic.   Will labor down as patient does not feel urge to push Due to minimal variability, will reduce pitocin to 8 mu.   Labor: Complete, laboring down.  Fetal Wellbeing:  Category II Pain Control:  Epidural Anticipated MOD:  NSVD  10/27/2019 08/29/2019, 7:37 PM

## 2019-08-29 NOTE — Anesthesia Preprocedure Evaluation (Signed)
Anesthesia Evaluation  Patient identified by MRN, date of birth, ID band Patient awake    Reviewed: Allergy & Precautions, NPO status , Patient's Chart, lab work & pertinent test results  Airway Mallampati: II  TM Distance: >3 FB Neck ROM: Full    Dental no notable dental hx.    Pulmonary neg pulmonary ROS,    Pulmonary exam normal breath sounds clear to auscultation       Cardiovascular negative cardio ROS Normal cardiovascular exam Rhythm:Regular Rate:Normal     Neuro/Psych negative neurological ROS  negative psych ROS   GI/Hepatic negative GI ROS, Neg liver ROS,   Endo/Other  negative endocrine ROS  Renal/GU negative Renal ROS  negative genitourinary   Musculoskeletal negative musculoskeletal ROS (+)   Abdominal (+) + obese,   Peds negative pediatric ROS (+)  Hematology negative hematology ROS (+)   Anesthesia Other Findings   Reproductive/Obstetrics (+) Pregnancy                             Anesthesia Physical Anesthesia Plan  ASA: II  Anesthesia Plan: Epidural   Post-op Pain Management:    Induction: Intravenous  PONV Risk Score and Plan:   Airway Management Planned:   Additional Equipment:   Intra-op Plan:   Post-operative Plan: Extubation in OR  Informed Consent: I have reviewed the patients History and Physical, chart, labs and discussed the procedure including the risks, benefits and alternatives for the proposed anesthesia with the patient or authorized representative who has indicated his/her understanding and acceptance.     Dental advisory given  Plan Discussed with: CRNA  Anesthesia Plan Comments:         Anesthesia Quick Evaluation

## 2019-08-29 NOTE — Anesthesia Procedure Notes (Signed)
Epidural Patient location during procedure: OB Start time: 08/29/2019 10:31 AM End time: 08/29/2019 10:38 AM  Staffing Anesthesiologist: Bethena Midget, MD  Preanesthetic Checklist Completed: patient identified, IV checked, site marked, risks and benefits discussed, surgical consent, monitors and equipment checked, pre-op evaluation and timeout performed  Epidural Patient position: sitting Prep: DuraPrep and site prepped and draped Patient monitoring: continuous pulse ox and blood pressure Approach: midline Location: L3-L4 Injection technique: LOR air  Needle:  Needle type: Tuohy  Needle gauge: 17 G Needle length: 9 cm and 9 Needle insertion depth: 9 cm Catheter type: closed end flexible Catheter size: 19 Gauge Catheter at skin depth: 14 cm Test dose: negative  Assessment Events: blood not aspirated, injection not painful, no injection resistance, no paresthesia and negative IV test

## 2019-08-29 NOTE — Progress Notes (Signed)
Kristin Zimmerman is a 24 y.o. G1P0 at 101w0d admitted for induction of labor due tonew onset gestational hypertension.  Subjective: Requesting IV pain meds. FB still in place.  Objective: BP 113/60   Pulse (!) 125   Temp 98.2 F (36.8 C) (Oral)   Resp 16   Ht 5\' 10"  (1.778 m)   Wt 130.8 kg   LMP 11/29/2018 (Exact Date)   SpO2 100%   BMI 41.37 kg/m  No intake/output data recorded.  FHT:  FHR: 145 bpm, variability: moderate,  accelerations:  Present,  decelerations:  Absent UC:   irregular, every 2-7 minutes  SVE:   Dilation: 1 Effacement (%): Thick Station: -3 Exam by:: [email or web address]  Labs: Lab Results  Component Value Date   WBC 10.2 08/28/2019   HGB 11.8 (L) 08/28/2019   HCT 37.1 08/28/2019   MCV 81.4 08/28/2019   PLT 184 08/28/2019    Assessment / Plan: 24 yo G1P0 here for IOL 2/2 new onset gHTN  Labor:S/p cyto x3. FB placed at 0113. I expect that the FB will come out soon. Consider Pitocin and AROM when appropriate. Fetal Wellbeing:Category I Pain Control:per patient request Gestational hypertension:Pre-E labs normal, P:Cr 0.19. No symptoms. One severe range BP at 0113, but was during FB placement and resolved without treatment. Has been mostly normotensive with one elevated pressure since. I/D:GBS negative Anticipated 30, CS as appropriate  IPJ:ASNKNLZ DO OB Fellow, Faculty [facility] 08/29/2019, 5:41 AM

## 2019-08-29 NOTE — Progress Notes (Signed)
   Kristin Zimmerman is a 24 y.o. G1P0 at [redacted]w[redacted]d  admitted for  [facility].   1153: Notified by RN that patient was having tachysystole and variables; recommended to d/c pitocin for now and continue to watch closely.   1258:   Subjective: Patient resting in bed, no complaints. No signs of pre-e.   Objective: Vitals:   08/29/19 1200 08/29/19 1215 08/29/19 1230 08/29/19 1300  BP: 136/84  123/73 (!) 146/72  Pulse: (!) 126  (!) 125 (!) 124  Resp: 17   18  Temp:  99 F (37.2 C)    TempSrc:  Oral    SpO2:      Weight:      Height:       Total I/O In: -  Out: 325 [Urine:325]  FHT:  FHR: 165 bpm, variability: moderate,  accelerations:  Present,  decelerations:  Absent UC:   irregular, difficult to trace.  SVE:   Dilation: 6 Effacement (%): 70 Station: -2 Exam by:: Heydy Montilla cnm Pitocin @ 0 mu/min  Labs: Lab Results  Component Value Date   WBC 13.5 (H) 08/29/2019   HGB 11.1 (L) 08/29/2019   HCT 35.5 (L) 08/29/2019   MCV 81.2 08/29/2019   PLT 175 08/29/2019    Assessment / Plan: AROM and IUPC placed ;  Mod meconium without odor.  Patient does not meet criteria for Triple I at this time; will continue to monitor. BPs are are mildly elevated.  Pitocin will remain off until tracing is more reassuring.   Labor: latent labor Fetal Wellbeing:  Category II FHR has moderate variability and accelartions at this time, however uterus was tachy systole and baseline is 165. Will continue to monitor closely.  Pain Control:  Epidural Anticipated MOD:  NSVD  Charlesetta Garibaldi Jeferson Boozer 08/29/2019, 1:28 PM

## 2019-08-30 ENCOUNTER — Encounter (HOSPITAL_COMMUNITY): Payer: Self-pay | Admitting: Student

## 2019-08-30 DIAGNOSIS — Z3A39 39 weeks gestation of pregnancy: Secondary | ICD-10-CM

## 2019-08-30 DIAGNOSIS — O134 Gestational [pregnancy-induced] hypertension without significant proteinuria, complicating childbirth: Secondary | ICD-10-CM

## 2019-08-30 LAB — CBC
HCT: 31.1 % — ABNORMAL LOW (ref 36.0–46.0)
HCT: 28.1 % — ABNORMAL LOW (ref 36.0–46.0)
Hemoglobin: 9.9 g/dL — ABNORMAL LOW (ref 12.0–15.0)
Hemoglobin: 8.9 g/dL — ABNORMAL LOW (ref 12.0–15.0)
MCH: 25.6 pg — ABNORMAL LOW (ref 26.0–34.0)
MCH: 25.8 pg — ABNORMAL LOW (ref 26.0–34.0)
MCHC: 31.8 g/dL (ref 30.0–36.0)
MCHC: 31.7 g/dL (ref 30.0–36.0)
MCV: 80.4 fL (ref 80.0–100.0)
MCV: 81.4 fL (ref 80.0–100.0)
Platelets: 164 10*3/uL (ref 150–400)
Platelets: 172 10*3/uL (ref 150–400)
RBC: 3.87 MIL/uL (ref 3.87–5.11)
RBC: 3.45 MIL/uL — ABNORMAL LOW (ref 3.87–5.11)
RDW: 14.5 % (ref 11.5–15.5)
RDW: 14.6 % (ref 11.5–15.5)
WBC: 19 10*3/uL — ABNORMAL HIGH (ref 4.0–10.5)
WBC: 22.7 10*3/uL — ABNORMAL HIGH (ref 4.0–10.5)
nRBC: 0 % (ref 0.0–0.2)
nRBC: 0.1 % (ref 0.0–0.2)

## 2019-08-30 MED ORDER — SIMETHICONE 80 MG PO CHEW: 80.0000 mg | CHEWABLE_TABLET | ORAL | Status: DC | PRN

## 2019-08-30 MED ORDER — WITCH HAZEL-GLYCERIN EX PADS: 1.0000 "application " | MEDICATED_PAD | CUTANEOUS | Status: DC | PRN

## 2019-08-30 MED ORDER — ONDANSETRON HCL 4 MG PO TABS: 4.0000 mg | ORAL_TABLET | ORAL | Status: DC | PRN

## 2019-08-30 MED ORDER — MAGNESIUM HYDROXIDE 400 MG/5ML PO SUSP: 30.0000 mL | ORAL | Status: DC | PRN

## 2019-08-30 MED ORDER — SODIUM CHLORIDE 0.9 % IV SOLN
510.0000 mg | Freq: Once | INTRAVENOUS | Status: AC
  Administered 2019-08-30: 12:00:00 510 mg via INTRAVENOUS
  Filled 2019-08-30: qty 17

## 2019-08-30 MED ORDER — ONDANSETRON HCL 4 MG/2ML IJ SOLN: 4.0000 mg | INTRAMUSCULAR | Status: DC | PRN

## 2019-08-30 MED ORDER — ACETAMINOPHEN 325 MG PO TABS
650.0000 mg | ORAL_TABLET | ORAL | Status: DC | PRN
  Administered 2019-08-30 (×4): 650 mg via ORAL
  Filled 2019-08-30 (×5): qty 2

## 2019-08-30 MED ORDER — PRENATAL MULTIVITAMIN CH
1.0000 | ORAL_TABLET | Freq: Every day | ORAL | Status: DC
  Administered 2019-08-30 – 2019-08-31 (×2): 1 via ORAL
  Filled 2019-08-30 (×2): qty 1

## 2019-08-30 MED ORDER — IBUPROFEN 600 MG PO TABS
600.0000 mg | ORAL_TABLET | Freq: Four times a day (QID) | ORAL | Status: DC
  Administered 2019-08-30 – 2019-08-31 (×6): 600 mg via ORAL
  Filled 2019-08-30 (×6): qty 1

## 2019-08-30 MED ORDER — DIPHENHYDRAMINE HCL 25 MG PO CAPS: 25.0000 mg | ORAL_CAPSULE | Freq: Four times a day (QID) | ORAL | Status: DC | PRN

## 2019-08-30 MED ORDER — OXYCODONE HCL 5 MG PO TABS: 10.0000 mg | ORAL_TABLET | ORAL | Status: DC | PRN

## 2019-08-30 MED ORDER — OXYCODONE HCL 5 MG PO TABS
5.0000 mg | ORAL_TABLET | ORAL | Status: DC | PRN
  Administered 2019-08-30 (×3): 5 mg via ORAL
  Filled 2019-08-30 (×3): qty 1

## 2019-08-30 MED ORDER — COCONUT OIL OIL: 1.0000 "application " | TOPICAL_OIL | Status: DC | PRN

## 2019-08-30 MED ORDER — BENZOCAINE-MENTHOL 20-0.5 % EX AERO
1.0000 "application " | INHALATION_SPRAY | CUTANEOUS | Status: DC | PRN
  Administered 2019-08-30 – 2019-08-31 (×2): 1 via TOPICAL
  Filled 2019-08-30 (×2): qty 56

## 2019-08-30 MED ORDER — DIBUCAINE (PERIANAL) 1 % EX OINT
1.0000 "application " | TOPICAL_OINTMENT | CUTANEOUS | Status: DC | PRN
  Administered 2019-08-30: 1 via RECTAL
  Filled 2019-08-30: qty 28

## 2019-08-30 MED ORDER — SENNOSIDES-DOCUSATE SODIUM 8.6-50 MG PO TABS
2.0000 | ORAL_TABLET | ORAL | Status: DC
  Administered 2019-08-30: 23:00:00 2 via ORAL
  Filled 2019-08-30: qty 2

## 2019-08-30 NOTE — Lactation Note (Signed)
This note was copied from a baby's chart. Lactation Consultation Note  Patient Name: Kristin Zimmerman HSVEX'O Date: 08/30/2019   Initial visit at 14 hours of life. Mom reports + significant breast changes w/pregnancy. Previous nipple piercings were noted. Mom reports that she removed the piercings prior to pregnancy.   When I first entered room, infant had just unlatched from feeding & he was in a quiet, alert state. He soon began cueing & Mom permitted me to assist her in latching infant with Mom in a side-lying position. He latched with ease & Mom was comfortable with latch. Mom was pleased and commented that infant "liked" this position. I asked Mom to try & not to fall asleep while in this position & to place infant in bassinet once feeding was done. She verbalized that she would not fall asleep during the feeding.   Infant's labial frenum noted to have bifurcated infant's top gum. Mom was made aware of O/P services, breastfeeding support groups, and our phone # for post-discharge questions.    Lurline Hare [facility] 08/30/2019, 1:52 PM

## 2019-08-30 NOTE — Discharge Summary (Addendum)
Postpartum Discharge Summary    Patient Name: Kristin Zimmerman DOB: 11-13-1995 MRN: [id number]  Date of admission: 08/28/2019 Delivering Provider: Clarnce Flock   Date of discharge: 08/31/2019  Admitting diagnosis: Indication for care in labor and delivery, antepartum [O75.9] Intrauterine pregnancy: [redacted]w[redacted]d    Secondary diagnosis:  Active Problems:   Supervision of normal first pregnancy, antepartum   Indication for care in labor and delivery, antepartum   Vacuum extractor delivery, delivered   Gestational hypertension  Additional problems:      Discharge diagnosis: Term Pregnancy Delivered and Gestational Hypertension                                                                                                Post partum procedures:None  Augmentation: AROM, Pitocin, Cytotec and Foley Balloon  Complications: None  Hospital course:  Induction of Labor With Vaginal Delivery   24y.o. yo G1P1001 at 327w0das admitted to the hospital 08/28/2019 for induction of labor.  Indication for induction: Gestational hypertension.  Patient had an uncomplicated labor course as follows: induced with cyto x3, FB, AROM, and pitocin. After pushing for 2.5 hours patient was exhausted and FHT was Cat II, at which point she was offered and accepted successful VAVD.  Membrane Rupture Time/Date: 12:48 PM ,08/29/2019   Intrapartum Procedures: Episiotomy: None [1]                                         Lacerations:  2nd degree [3]  Patient had delivery of a Viable infant.  Information for the patient's newborn:  Kristin, Zimmerman[[id number]]Delivery Method: Vaginal, Vacuum (Extractor)(Filed from Delivery Summary)    08/29/2019  Details of delivery can be found in separate delivery note.    Patient had a routine postpartum course.  BP's were well controlled, ordered for home BP cuff as did not have one at time of discharge. Has appt for BP check on 09/03/2019.   Patient is discharged home  08/31/19. Delivery time: 11:05 PM    Magnesium Sulfate received: No BMZ received: No Rhophylac:N/A MMR:N/A Transfusion:No  Physical exam  Vitals:   08/30/19 1152 08/30/19 1342 08/30/19 2158 08/31/19 0507  BP: 117/73 120/77 125/81 112/79  Pulse: (!) 106 (!) 119 (!) 108 (!) 118  Resp: '20 19 18 18  ' Temp: 98 F (36.7 C) 98.5 F (36.9 C) 98.2 F (36.8 C) 98.4 F (36.9 C)  TempSrc:  Axillary Oral Oral  SpO2: 100% 99% 100% 100%  Weight:      Height:       General: alert, cooperative and no distress Lochia: appropriate Uterine Fundus: firm Incision: N/A DVT Evaluation: No evidence of DVT seen on physical exam. Mild LE edema bilaterally Labs: Lab Results  Component Value Date   WBC 22.7 (H) 08/30/2019   HGB 8.9 (L) 08/30/2019   HCT 28.1 (L) 08/30/2019   MCV 81.4 08/30/2019   PLT 172 08/30/2019   CMP Latest Ref Rng & Units 08/28/2019  Glucose 70 - 99  mg/dL 128(H)  BUN 6 - 20 mg/dL 5(L)  Creatinine 0.44 - 1.00 mg/dL 0.75  Sodium 135 - 145 mmol/L 141  Potassium 3.5 - 5.1 mmol/L 4.1  Chloride 98 - 111 mmol/L 111  CO2 22 - 32 mmol/L 20(L)  Calcium 8.9 - 10.3 mg/dL 9.1  Total Protein 6.5 - 8.1 g/dL 5.8(L)  Total Bilirubin 0.3 - 1.2 mg/dL 0.3  Alkaline Phos 38 - 126 U/L 147(H)  AST 15 - 41 U/L 24  ALT 0 - 44 U/L 22    Discharge instruction: per After Visit Summary and "Baby and Me Booklet".  After visit meds:  Allergies as of 08/31/2019      Reactions   Other Nausea And Vomiting   Lobster      Medication List    STOP taking these medications   [facility] Prenatal Gummy 0.4-113.5 MG Chew   Vitafol Gummies 3.33-0.333-34.8 MG Chew     TAKE these medications   acetaminophen 325 MG tablet Commonly known as: Tylenol Take 2 tablets (650 mg total) by mouth every 4 (four) hours as needed (for pain scale < 4).   ibuprofen 600 MG tablet Commonly known as: ADVIL Take 1 tablet (600 mg total) by mouth every 6 (six) hours.   montelukast 10 MG tablet Commonly known as:  SINGULAIR Take by mouth.   PRENATAL VITAMINS PO Take by mouth.       Diet: routine diet  Activity: Advance as tolerated. Pelvic rest for 6 weeks.   Outpatient follow up:6 weeks Follow up Appt: Future Appointments  Date Time Provider [place]  09/03/2019  2:35 PM Lavonia Drafts, MD [facility] None   Follow up Visit:  Please schedule this patient for Postpartum visit in: 6 weeks with the following provider: Any provider For C/S patients schedule nurse incision check in weeks 2 weeks: no High risk pregnancy complicated by: gHTN Delivery mode:  Vacuum Anticipated Birth Control: Patch PP Procedures needed: BP check  Schedule Integrated [place] visit: no   Newborn Data: Live born female  Birth Weight: 7 lb 1.8 oz (3226 g) APGAR: 8, 9  Newborn Delivery   Birth date/time: 08/29/2019 23:05:00 Delivery type: Vaginal, Vacuum (Extractor)      Baby Feeding: Breast Disposition:home with mother   08/31/2019 Clarnce Flock, MD

## 2019-08-30 NOTE — Anesthesia Postprocedure Evaluation (Signed)
Anesthesia Post Note  Patient: Kristin Zimmerman  Procedure(s) Performed: AN AD HOC LABOR EPIDURAL     Patient location during evaluation: Mother Baby Anesthesia Type: Epidural Level of consciousness: awake and alert Pain management: pain level controlled Vital Signs Assessment: post-procedure vital signs reviewed and stable Respiratory status: spontaneous breathing, nonlabored ventilation and respiratory function stable Cardiovascular status: stable Postop Assessment: no headache, no backache and epidural receding Anesthetic complications: no    Last Vitals:  Vitals:   08/30/19 0230 08/30/19 0333  BP: (!) 133/92 136/77  Pulse: (!) 140 (!) 141  Resp: 18 20  Temp: 37 C 37 C  SpO2: 100% 100%    Last Pain:  Vitals:   08/30/19 0537  TempSrc:   PainSc: 3    Pain Goal:                   Rica Records

## 2019-08-30 NOTE — Progress Notes (Signed)
POSTPARTUM PROGRESS NOTE  Post Partum Day 1  Subjective:  Kristin Zimmerman is a 24 y.o. G1P1001 s/p VAVD at [redacted]w[redacted]d.  She reports she is doing well. No acute events overnight. She denies any problems with ambulating, voiding or po intake. Denies nausea or vomiting.  Pain is well controlled.  Lochia is slightly heavier than her usual period.  Objective: Blood pressure 136/77, pulse (!) 141, temperature 98.6 F (37 C), temperature source Axillary, resp. rate 20, height 5\' 10"  (1.778 m), weight 130.8 kg, last menstrual period 11/29/2018, SpO2 100 %, unknown if currently breastfeeding.  Physical Exam:  General: alert, cooperative and no distress Chest: no respiratory distress Heart:regular rate, distal pulses intact Abdomen: soft, nontender,  Uterine Fundus: firm, appropriately tender DVT Evaluation: No calf swelling or tenderness Extremities: no LE edema Skin: warm, dry  Recent Labs    08/30/19 0024 08/30/19 0530  HGB 9.9* 8.9*  HCT 31.1* 28.1*    Assessment/Plan: Kristin Zimmerman is a 24 y.o. G1P1001 s/p VAVD at [redacted]w[redacted]d   PPD#1 - Doing well  Routine postpartum care Contraception: patch Feeding: breast Anemia: hgb drop 11.1>8.9 since admission and close to 1L QBL at delivery, amenable to feraheme.  Dispo: Plan for discharge PPD#2 (late delivery).   LOS: 2 days   [redacted]w[redacted]d, MD/MPH OB Fellow  08/30/2019, 7:24 AM

## 2019-08-30 NOTE — Progress Notes (Signed)
RN notified Dr. Crissie Reese of patients' resting HR of 140. Pt has had increased HR throughout labor and received a 1 L bolus in L&D before coming up to [facility]. Pt is asymptomatic. Dr. Crissie Reese is aware and has ordered a 0500 CBC.    Elvia Collum, RN 08/30/19

## 2019-08-31 ENCOUNTER — Other Ambulatory Visit: Payer: Self-pay

## 2019-08-31 DIAGNOSIS — O139 Gestational [pregnancy-induced] hypertension without significant proteinuria, unspecified trimester: Secondary | ICD-10-CM

## 2019-08-31 MED ORDER — ACETAMINOPHEN 325 MG PO TABS: 650.0000 mg | ORAL_TABLET | ORAL | 0 refills | Status: AC | PRN

## 2019-08-31 MED ORDER — BLOOD PRESSURE MONITORING MISC: 1.0000 | Freq: Every day | 0 refills | Status: AC

## 2019-08-31 MED ORDER — IBUPROFEN 600 MG PO TABS: 600.0000 mg | ORAL_TABLET | Freq: Four times a day (QID) | ORAL | 0 refills | Status: AC

## 2019-08-31 NOTE — Discharge Instructions (Signed)

## 2019-08-31 NOTE — Lactation Note (Addendum)
This note was copied from a baby's chart. Lactation Consultation Note  Patient Name: Kristin Zimmerman [id number] Date: 08/31/2019   [facility] visit attempted, but Mom was asleep. I noted that Dad was sleeping on the couch with the infant on his chest. I woke Dad up and let him know that wasn't permissible & asked him if he could place infant in bassinet. Dad nodded. Dad was getting out of bed to put baby in bassinet as I left room.   LC to return later.   Lurline Hare [facility] 08/31/2019, 9:19 AM

## 2019-08-31 NOTE — Lactation Note (Signed)
This note was copied from a baby's chart. Lactation Consultation Note  Patient Name: Kristin Zimmerman ZCHYI'F Date: 08/31/2019   Infant is 28 hrs old. Infant latches with relative ease. Some mild dimpling was noted at times. I detected some swallowing, but not as much as yesterday, (although RNs have given good very good LATCH scores). Mom is comfortable with latch; Mom says her breasts feel heavier today.  Hand was expression taught to Mom & the resulting EBM was finger-fed to infant on a gloved finger. Infant was noted to have a good suck & could easily bring the finger to the junction of the hard & soft palate. We were able to see colostrum from the R breast, but not the L breast while doing hand expression (although I anticipate that will change as Mom's hand expression technique improves).   I also provided her with a hand pump & a size 21 flange. Mom feels that the size 21 flange is good for her R side & the size 24 flange for her L breast. The previous nipple piercings on her R nipple are patent.    Mom knows how to reach me if she'd like me to return today.   Plan: 1. Offer breast as much as "True" desires, listening for swallows & doing breast compressions during feeding to increase frequency of swallows.  2. Work on hand expressing or using hand pump to express milk. Then Mom can finger-feed or spoon-feed to infant to ensure adequate intake. Mom verbalized understanding & seemed willing.   Lurline Hare [facility] 08/31/2019, 1:01 PM

## 2019-09-01 MED FILL — ACETAMINOPHEN 325 MG TABS: 325 | 9 days supply | Qty: 100 | Fill #0

## 2019-09-01 MED FILL — IBUPROFEN 600 MG TABLET: 600 | 8 days supply | Qty: 30 | Fill #0

## 2019-09-01 MED FILL — OMRON 3 SERIES BP MONITOR D: 1 days supply | Qty: 1 | Fill #0

## 2019-09-03 ENCOUNTER — Encounter: Payer: Medicaid Other | Admitting: Obstetrics & Gynecology

## 2019-09-05 ENCOUNTER — Inpatient Hospital Stay (HOSPITAL_COMMUNITY): Admission: RE | Admit: 2019-09-05 | Payer: Medicaid Other | Source: Home / Self Care

## 2019-09-18 ENCOUNTER — Ambulatory Visit: Payer: Medicaid Other | Admitting: Family Medicine

## 2021-02-04 IMAGING — US US MFM OB DETAIL+14 WK
1 series · 13 of 28 positions shown · non-contrast
Comparison: none

[Series 1: us mfm ob detail+14 wk · 13 of 85 slices shown]
[im 4/85]
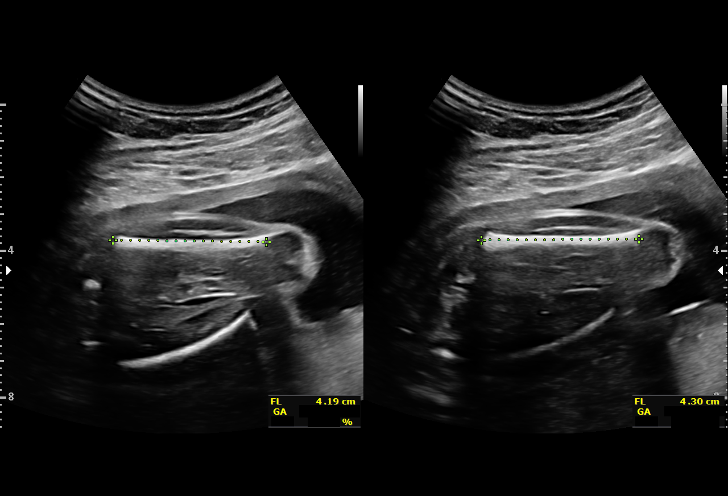
[im 10/85]
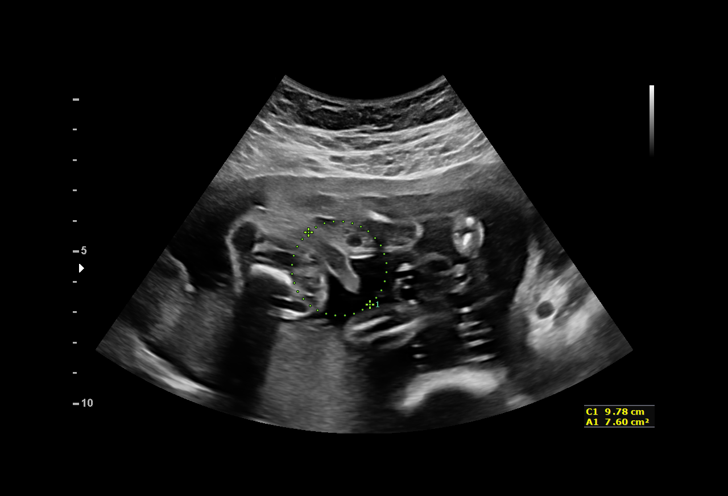
[im 16/85]
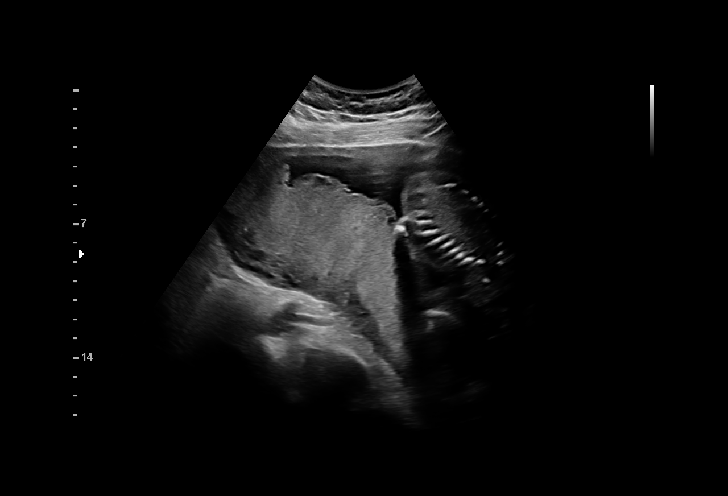
[im 22/85]
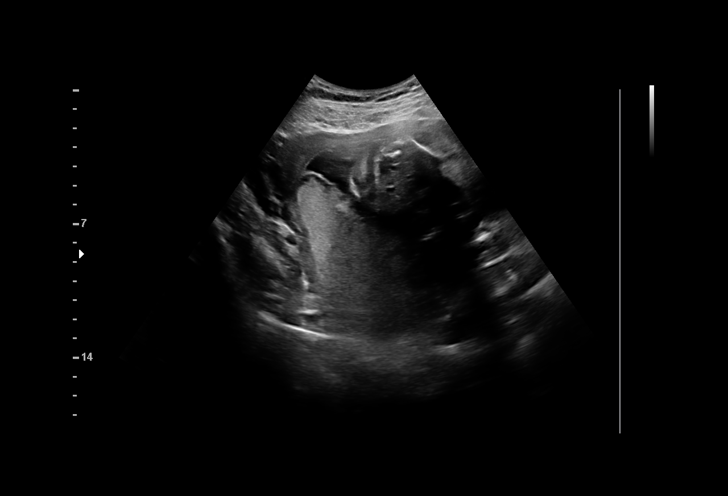
[im 29/85]
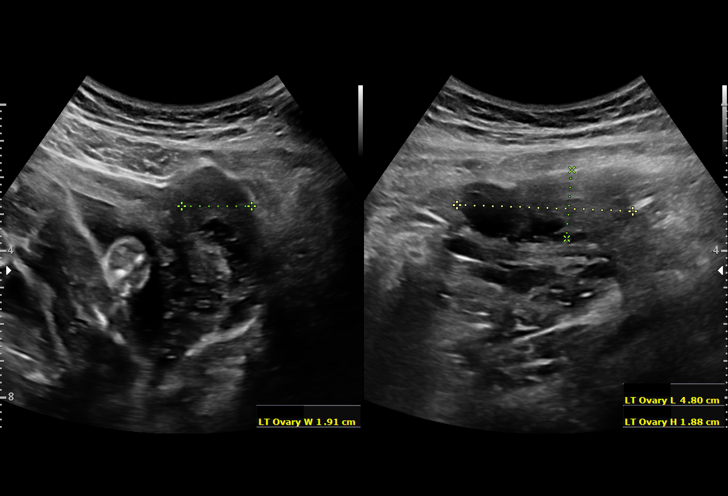
[im 35/85]
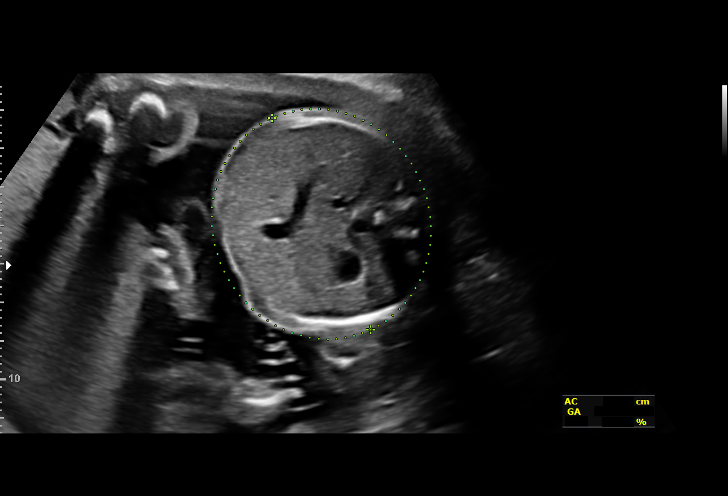
[im 44/85]
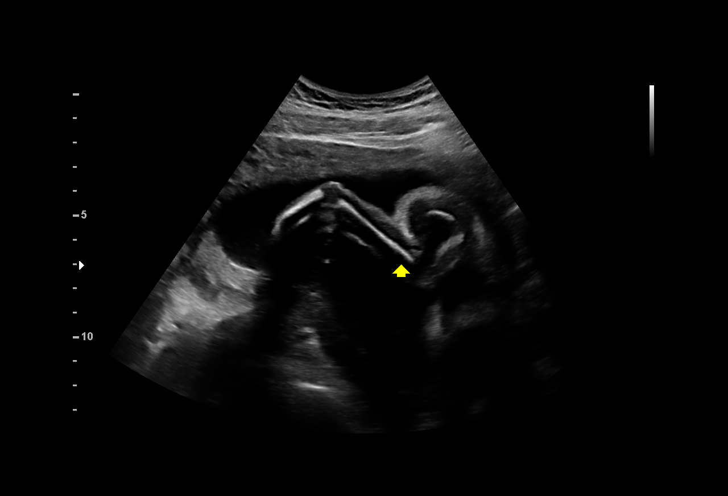
[im 50/85]
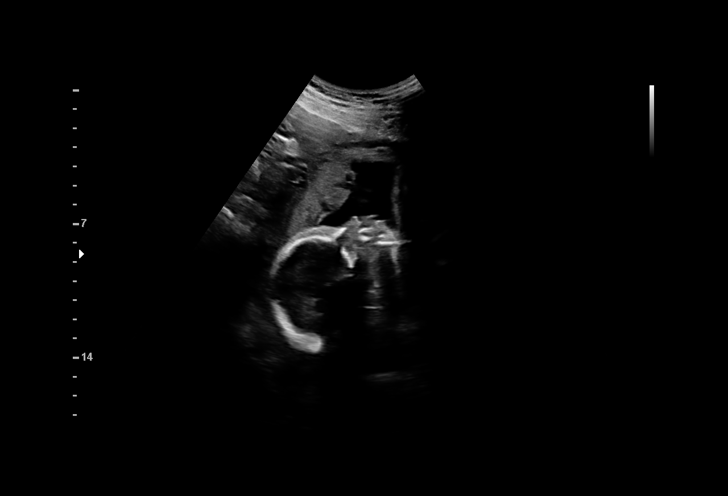
[im 57/85]
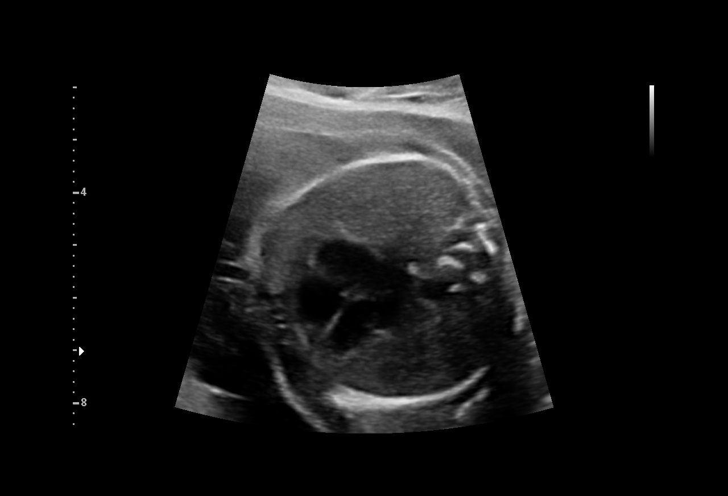
[im 63/85]
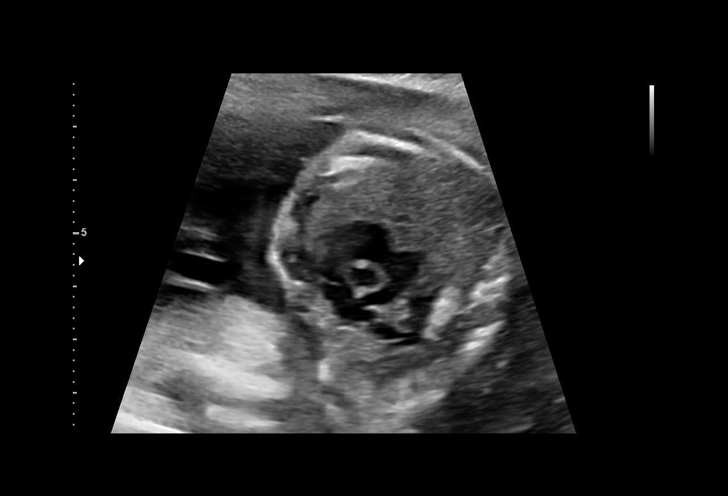
[im 69/85]
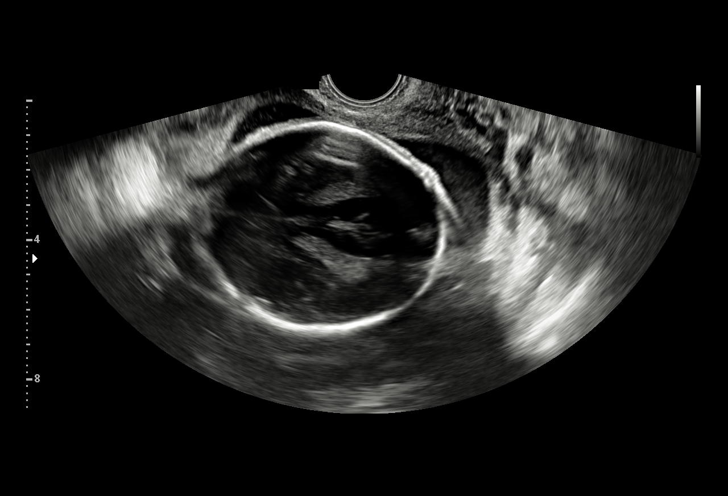
[im 75/85]
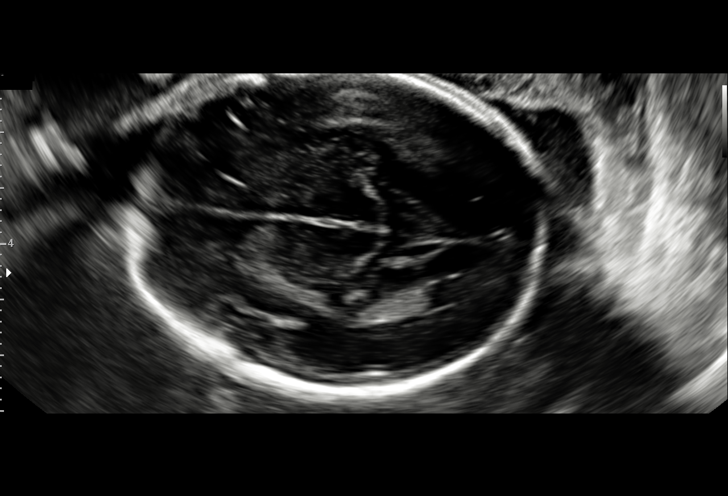
[im 81/85]
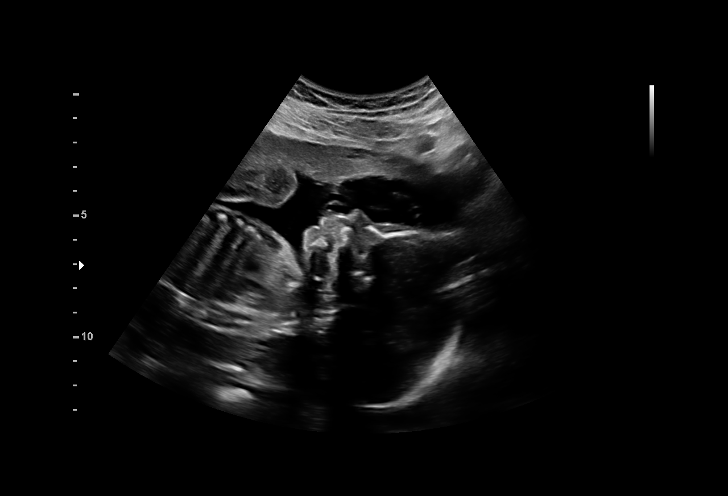

[13 of 28 positions shown; findings below may reference images not displayed]

[REDACTED]
                   NONNENMACHER CNM

 ----------------------------------------------------------------------

 ----------------------------------------------------------------------
Indications

  Obesity complicating pregnancy, second
  trimester (Pre Pregnancy BMI 35)
  Encounter for cervical length
  Encounter for antenatal screening for
  malformations (neg AFP)
  23 weeks gestation of pregnancy
 ----------------------------------------------------------------------
Vital Signs

                                                Height:        5'7"
Fetal Evaluation

 Num Of Fetuses:         1
 Fetal Heart Rate(bpm):  158
 Cardiac Activity:       Observed
 Presentation:           Cephalic
 Placenta:               Posterior
 P. Cord Insertion:      Visualized

 Amniotic Fluid
 AFI FV:      Within normal limits

                             Largest Pocket(cm)

Biometry
 BPD:      58.1  mm     G. Age:  23w 6d         47  %    CI:        69.06   %    70 - 86
                                                         FL/HC:      18.9   %    18.7 -
 HC:      223.3  mm     G. Age:  24w 3d         58  %    HC/AC:      1.21        1.05 -
 AC:      184.3  mm     G. Age:  23w 2d         27  %    FL/BPD:     72.8   %    71 - 87
 FL:       42.3  mm     G. Age:  23w 6d         40  %    FL/AC:      23.0   %    20 - 24
 HUM:        41  mm     G. Age:  24w 6d         68  %
 CER:      26.5  mm     G. Age:  24w 2d         60  %
 LV:        4.4  mm
 CM:        5.6  mm

 Est. FW:     609  gm      1 lb 5 oz     35  %
OB History

 Gravidity:    1
Gestational Age

 LMP:           23w 5d        Date:  11/29/18                 EDD:   09/05/19
 U/S Today:     23w 6d                                        EDD:   09/04/19
 Best:          23w 5d     Det. By:  LMP  (11/29/18)          EDD:   09/05/19
Anatomy

 Cranium:               Appears normal         LVOT:                   Appears normal
 Cavum:                 Appears normal         Aortic Arch:            Not well visualized
 Ventricles:            Appears normal         Ductal Arch:            Not well visualized
 Choroid Plexus:        Appears normal         Diaphragm:              Appears normal
 Cerebellum:            Appears normal         Stomach:                Appears normal, left
                                                                       sided
 Posterior Fossa:       Appears normal         Abdomen:                Appears normal
 Nuchal Fold:           Not applicable (>20    Abdominal Wall:         Appears nml (cord
                        wks GA)                                        insert, abd wall)
 Face:                  Appears normal         Cord Vessels:           Appears normal (3
                        (orbits and profile)                           vessel cord)
 Lips:                  Appears normal         Kidneys:                Appear normal
 Palate:                Not well visualized    Bladder:                Appears normal
 Thoracic:              Appears normal         Spine:                  Not well visualized
 Heart:                 Appears normal         Upper Extremities:      Appears normal
                        (4CH, axis, and
                        situs)
 RVOT:                  Appears normal         Lower Extremities:      Appears normal

 Other:  Parents do not wish to know sex of fetus. Technically difficult due to
         fetal position.
Cervix Uterus Adnexa

 Cervix
 Length:              3  cm.
 Measured transvaginally.

 Uterus
 No abnormality visualized.

 Left Ovary
 Size(cm)       4.8  x   1.9    x  1.9       Vol(ml):
 Within normal limits.
 Right Ovary
 Size(cm)       5.2  x    2     x  2.8       Vol(ml):
 Within normal limits.
Impression

 Normal interval growth.  No ultrasonic evidence of structural
 fetal anomalies.
 Suboptimal views of the fetal anatomy obtained secondary to
 fetal position.
Recommendations

 Follow up growth in 4 weeks to complete the fetal anatomy
